# Patient Record
Sex: Male | Born: 1955 | Race: White | Hispanic: No | Marital: Married | Smoking: Former smoker
Health system: Southern US, Community
[De-identification: ages and names within clinical notes are randomized; demographics above are authoritative.]

## PROBLEM LIST (undated history)

## (undated) DIAGNOSIS — G4730 Sleep apnea, unspecified: Secondary | ICD-10-CM

## (undated) DIAGNOSIS — I10 Essential (primary) hypertension: Secondary | ICD-10-CM

## (undated) HISTORY — PX: NECK SURGERY: SHX720

## (undated) HISTORY — PX: CHOLECYSTECTOMY: SHX55

## (undated) HISTORY — PX: TONSILLECTOMY: SUR1361

## (undated) HISTORY — PX: HERNIA REPAIR: SHX51

## (undated) HISTORY — PX: JOINT REPLACEMENT: SHX530

---

## 2013-04-04 DIAGNOSIS — G5620 Lesion of ulnar nerve, unspecified upper limb: Secondary | ICD-10-CM | POA: Insufficient documentation

## 2013-04-04 DIAGNOSIS — M7700 Medial epicondylitis, unspecified elbow: Secondary | ICD-10-CM | POA: Insufficient documentation

## 2013-04-04 DIAGNOSIS — M7520 Bicipital tendinitis, unspecified shoulder: Secondary | ICD-10-CM | POA: Insufficient documentation

## 2013-04-04 DIAGNOSIS — M542 Cervicalgia: Secondary | ICD-10-CM | POA: Insufficient documentation

## 2013-04-23 ENCOUNTER — Ambulatory Visit: Payer: Self-pay | Admitting: Neurology

## 2013-06-12 DIAGNOSIS — Z87448 Personal history of other diseases of urinary system: Secondary | ICD-10-CM | POA: Insufficient documentation

## 2013-06-16 DIAGNOSIS — IMO0001 Reserved for inherently not codable concepts without codable children: Secondary | ICD-10-CM | POA: Insufficient documentation

## 2013-06-16 DIAGNOSIS — E559 Vitamin D deficiency, unspecified: Secondary | ICD-10-CM | POA: Insufficient documentation

## 2013-06-20 DIAGNOSIS — K219 Gastro-esophageal reflux disease without esophagitis: Secondary | ICD-10-CM | POA: Insufficient documentation

## 2013-06-20 DIAGNOSIS — M4802 Spinal stenosis, cervical region: Secondary | ICD-10-CM | POA: Insufficient documentation

## 2013-06-20 DIAGNOSIS — N400 Enlarged prostate without lower urinary tract symptoms: Secondary | ICD-10-CM | POA: Insufficient documentation

## 2015-03-18 ENCOUNTER — Other Ambulatory Visit: Payer: Self-pay | Admitting: Neurology

## 2015-03-18 DIAGNOSIS — M5412 Radiculopathy, cervical region: Secondary | ICD-10-CM

## 2015-03-25 ENCOUNTER — Ambulatory Visit: Admission: RE | Admit: 2015-03-25 | Payer: Self-pay | Source: Ambulatory Visit

## 2015-03-26 ENCOUNTER — Ambulatory Visit
Admission: RE | Admit: 2015-03-26 | Discharge: 2015-03-26 | Disposition: A | Discharge status: Home / Self Care | Payer: 59 | Source: Ambulatory Visit | Attending: Neurology | Admitting: Neurology

## 2015-03-26 DIAGNOSIS — M5412 Radiculopathy, cervical region: Secondary | ICD-10-CM

## 2015-07-19 DIAGNOSIS — R350 Frequency of micturition: Secondary | ICD-10-CM | POA: Insufficient documentation

## 2015-09-22 DIAGNOSIS — R0981 Nasal congestion: Secondary | ICD-10-CM | POA: Insufficient documentation

## 2015-10-15 ENCOUNTER — Other Ambulatory Visit: Payer: Self-pay | Admitting: Neurology

## 2015-10-15 DIAGNOSIS — M5412 Radiculopathy, cervical region: Secondary | ICD-10-CM

## 2015-11-03 ENCOUNTER — Ambulatory Visit
Admission: RE | Admit: 2015-11-03 | Discharge: 2015-11-03 | Disposition: A | Discharge status: Home / Self Care | Payer: 59 | Source: Ambulatory Visit | Attending: Neurology | Admitting: Neurology

## 2015-11-03 DIAGNOSIS — M4802 Spinal stenosis, cervical region: Secondary | ICD-10-CM | POA: Diagnosis not present

## 2015-11-03 DIAGNOSIS — J309 Allergic rhinitis, unspecified: Secondary | ICD-10-CM | POA: Insufficient documentation

## 2015-11-03 DIAGNOSIS — Z981 Arthrodesis status: Secondary | ICD-10-CM | POA: Diagnosis not present

## 2015-11-03 DIAGNOSIS — Z9889 Other specified postprocedural states: Secondary | ICD-10-CM | POA: Diagnosis present

## 2015-11-03 DIAGNOSIS — M5412 Radiculopathy, cervical region: Secondary | ICD-10-CM | POA: Diagnosis present

## 2015-11-03 DIAGNOSIS — M4682 Other specified inflammatory spondylopathies, cervical region: Secondary | ICD-10-CM | POA: Insufficient documentation

## 2015-11-03 MED ORDER — GADOBENATE DIMEGLUMINE 529 MG/ML IV SOLN
20.0000 mL | Freq: Once | INTRAVENOUS | Status: AC | PRN
  Administered 2015-11-03: 20 mL via INTRAVENOUS

## 2017-10-19 DIAGNOSIS — I10 Essential (primary) hypertension: Secondary | ICD-10-CM | POA: Insufficient documentation

## 2018-01-21 ENCOUNTER — Other Ambulatory Visit: Payer: Self-pay | Admitting: Neurology

## 2018-01-21 DIAGNOSIS — R200 Anesthesia of skin: Secondary | ICD-10-CM

## 2018-01-25 ENCOUNTER — Ambulatory Visit
Admission: RE | Admit: 2018-01-25 | Discharge: 2018-01-25 | Disposition: A | Discharge status: Home / Self Care | Payer: 59 | Source: Ambulatory Visit | Attending: Neurology | Admitting: Neurology

## 2018-01-25 ENCOUNTER — Ambulatory Visit
Admission: RE | Admit: 2018-01-25 | Discharge: 2018-01-25 | Disposition: A | Discharge status: Home / Self Care | Payer: Managed Care, Other (non HMO) | Source: Ambulatory Visit | Attending: Neurology | Admitting: Neurology

## 2018-01-25 DIAGNOSIS — Z9889 Other specified postprocedural states: Secondary | ICD-10-CM | POA: Diagnosis not present

## 2018-01-25 DIAGNOSIS — M4802 Spinal stenosis, cervical region: Secondary | ICD-10-CM | POA: Insufficient documentation

## 2018-01-25 DIAGNOSIS — G9589 Other specified diseases of spinal cord: Secondary | ICD-10-CM | POA: Insufficient documentation

## 2018-01-25 DIAGNOSIS — R200 Anesthesia of skin: Secondary | ICD-10-CM | POA: Diagnosis not present

## 2018-01-30 DIAGNOSIS — G5623 Lesion of ulnar nerve, bilateral upper limbs: Secondary | ICD-10-CM | POA: Insufficient documentation

## 2018-02-11 DIAGNOSIS — H6982 Other specified disorders of Eustachian tube, left ear: Secondary | ICD-10-CM | POA: Insufficient documentation

## 2018-02-12 DIAGNOSIS — M25521 Pain in right elbow: Secondary | ICD-10-CM | POA: Insufficient documentation

## 2018-04-29 DIAGNOSIS — S76119A Strain of unspecified quadriceps muscle, fascia and tendon, initial encounter: Secondary | ICD-10-CM | POA: Insufficient documentation

## 2018-07-17 DIAGNOSIS — D170 Benign lipomatous neoplasm of skin and subcutaneous tissue of head, face and neck: Secondary | ICD-10-CM | POA: Insufficient documentation

## 2018-07-17 DIAGNOSIS — M7061 Trochanteric bursitis, right hip: Secondary | ICD-10-CM | POA: Insufficient documentation

## 2018-08-19 DIAGNOSIS — Z6841 Body Mass Index (BMI) 40.0 and over, adult: Secondary | ICD-10-CM | POA: Insufficient documentation

## 2019-01-10 DIAGNOSIS — M1711 Unilateral primary osteoarthritis, right knee: Secondary | ICD-10-CM | POA: Insufficient documentation

## 2019-03-29 ENCOUNTER — Ambulatory Visit
Admission: EM | Admit: 2019-03-29 | Discharge: 2019-03-29 | Disposition: A | Discharge status: Home / Self Care | Payer: 59 | Attending: Family Medicine | Admitting: Family Medicine

## 2019-03-29 ENCOUNTER — Encounter: Payer: Self-pay | Admitting: Emergency Medicine

## 2019-03-29 ENCOUNTER — Other Ambulatory Visit: Payer: Self-pay

## 2019-03-29 DIAGNOSIS — M545 Low back pain, unspecified: Secondary | ICD-10-CM

## 2019-03-29 HISTORY — DX: Essential (primary) hypertension: I10

## 2019-03-29 MED ORDER — CYCLOBENZAPRINE HCL 10 MG PO TABS: 10.0000 mg | ORAL_TABLET | Freq: Two times a day (BID) | ORAL | 0 refills | Status: DC | PRN

## 2019-03-29 MED ORDER — PREDNISONE 10 MG PO TABS: ORAL_TABLET | ORAL | 0 refills | Status: DC

## 2019-03-29 NOTE — Discharge Instructions (Addendum)
Take medication as prescribed. Rest. Drink plenty of fluids. Ice.   Follow up with your primary care physician this week as needed. Return to Urgent care for new or worsening concerns.

## 2019-03-29 NOTE — ED Provider Notes (Signed)
[facility] ____________________________________________  Time seen: Approximately 9:12 AM  I have reviewed the triage vital signs and the nursing notes.   HISTORY  Chief Complaint Back Pain   HPI Darrell Rios is a 63 y.o. male presenting for evaluation of left lower back pain present gradually worsening over the last 1 week.  Patient denies any fall, direct injury or direct trauma.  Denies abrupt onset.  Patient did recently move a headboard and states the next morning he noticed some pain and states possibly related.  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  States the pain stays in the left lower back.  Denies pain radiation down legs.  States he also has a bad right knee and he often compensates when walking for this.  Denies rash.  Denies any urinary or bowel retention or incontinence.  Denies atypical paresthesias.  States he often feels a spasm catching pain with movement.  States pain is mostly with changing positions and walking.  Denies recent fevers, cough, congestion, dysuria or bowel changes.  Reports otherwise doing well.  Unresolved with over-the-counter Aleve and Tylenol.  Sharyne Peach, MD: PCP   Past Medical History:  Diagnosis Date  . Hypertension   Degenerative disc disease.  There are no active problems to display for this patient.   Past Surgical History:  Procedure Laterality Date  . CHOLECYSTECTOMY    . HERNIA REPAIR    . NECK SURGERY    . TONSILLECTOMY    Carpal tunnel and ulnar nerve release.   No current facility-administered medications for this encounter.   Current Outpatient Medications:  .  amlodipine-olmesartan (AZOR) 10-20 MG tablet, Take by mouth., Disp: , Rfl:  .  aspirin EC 81 MG tablet, Take by mouth., Disp: , Rfl:  .  fexofenadine-pseudoephedrine (ALLEGRA-D 24) 180-240 MG 24 hr  tablet, Take by mouth., Disp: , Rfl:  .  finasteride (PROSCAR) 5 MG tablet, Take by mouth., Disp: , Rfl:  .  gabapentin (NEURONTIN) 400 MG capsule, Take by mouth., Disp: , Rfl:  .  ibuprofen (ADVIL) 800 MG tablet, Take by mouth., Disp: , Rfl:  .  venlafaxine (EFFEXOR) 75 MG tablet, Take by mouth., Disp: , Rfl:  .  cyclobenzaprine (FLEXERIL) 10 MG tablet, Take 1 tablet (10 mg total) by mouth 2 (two) times daily as needed for muscle spasms. Do not drive while taking as can cause drowsiness, Disp: 15 tablet, Rfl: 0 .  predniSONE (DELTASONE) 10 MG tablet, Start 60 mg po day one, then 50 mg po day two, taper by 10 mg daily until complete., Disp: 21 tablet, Rfl: 0  Allergies Patient has no known allergies.  Family History  Problem Relation Age of Onset  . Cancer Father     Social History Social History   Tobacco Use  . Smoking status: Current Every Day Smoker    Types: Cigarettes  . Smokeless tobacco: Never Used  Substance Use Topics  . Alcohol use: Not Currently  . Drug use: Never    Review of Systems Constitutional: No fever ENT: No sore throat. Cardiovascular: Denies chest pain. Respiratory: Denies shortness of breath. Gastrointestinal: No abdominal pain.  No nausea, no vomiting.  No diarrhea.  No constipation. Genitourinary: Negative for dysuria. Musculoskeletal: Positive for back pain. Skin: Negative for rash. Neurological: Negative for headaches.   ____________________________________________   PHYSICAL EXAM:  VITAL  SIGNS: ED Triage Vitals  Enc Vitals Group     BP 03/29/19 0854 130/81     Pulse Rate 03/29/19 0854 72     Resp 03/29/19 0854 16     Temp 03/29/19 0854 98.4 F (36.9 C)     Temp Source 03/29/19 0854 Oral     SpO2 03/29/19 0854 99 %     Weight 03/29/19 0849 295 lb (133.8 kg)     Height 03/29/19 0849 5\' 9"  (1.753 m)     Head Circumference --      Peak Flow --      Pain Score 03/29/19 0848 6     Pain Loc --      Pain Edu? --      Excl. in [place]? --      Constitutional: Alert and oriented. Well appearing and in no acute distress. Eyes: Conjunctivae are normal.  ENT      Head: Normocephalic and atraumatic. Cardiovascular: Normal rate, regular rhythm. Grossly normal heart sounds.  Good peripheral circulation. Respiratory: Normal respiratory effort without tachypnea nor retractions. Breath sounds are clear and equal bilaterally. No wheezes, rales, rhonchi. Gastrointestinal: Soft and nontender. Obese abdomen. No CVA tenderness. Musculoskeletal: No midline cervical, thoracic or lumbar tenderness to palpation. Bilateral pedal pulses equal and easily palpated. Except: No midline tenderness.  Left lower para lumbar tenderness to direct palpation with palpable muscle spasm, mild pain with right and left lumbar rotation as well as flexion extension but full range of motion present, no saddle anesthesia, changes positions quickly, steady gait, no pain with standing bilateral knee lifts. Neurologic:  Normal speech and language. No gross focal neurologic deficits are appreciated. Speech is normal. No gait instability.  Skin:  Skin is warm, dry and intact. No rash noted. Psychiatric: Mood and affect are normal. Speech and behavior are normal. Patient exhibits appropriate insight and judgment   ___________________________________________   LABS (all labs ordered are listed, but only abnormal results are displayed)  Labs Reviewed - No data to display   PROCEDURES Procedures    INITIAL IMPRESSION / ASSESSMENT AND PLAN / ED COURSE  Pertinent labs & imaging results that were available during my care of the patient were reviewed by me and considered in my medical decision making (see chart for details).  Well-appearing patient.  No acute distress.  Left lower back pain.  No trauma.  No midline tenderness.  Suspect strain injuries.  Will start on oral prednisone and PRN Flexeril.  Encouraged ice, supportive care and monitoring.Discussed indication,  risks and benefits of medications with patient.  Discussed follow up with Primary care physician this week. Discussed follow up and return parameters including no resolution or any worsening concerns. Patient verbalized understanding and agreed to plan.   ____________________________________________   FINAL CLINICAL IMPRESSION(S) / ED DIAGNOSES  Final diagnoses:  Acute left-sided low back pain without sciatica     ED Discharge Orders         Ordered    predniSONE (DELTASONE) 10 MG tablet     03/29/19 0910    cyclobenzaprine (FLEXERIL) 10 MG tablet  2 times daily PRN     03/29/19 0910           Note: This dictation was prepared with Dragon dictation along with smaller phrase technology. Any transcriptional errors that result from this process are unintentional.         Marylene Land, NP 03/29/19 [phone]

## 2019-03-29 NOTE — ED Triage Notes (Signed)
Patient c/o left sided lower back pain for almost a week.  Patient denies injury or fall.  Patient denies any urinary problems.  Patient denies fevers.

## 2019-08-11 DIAGNOSIS — H6592 Unspecified nonsuppurative otitis media, left ear: Secondary | ICD-10-CM | POA: Insufficient documentation

## 2019-08-11 DIAGNOSIS — H9011 Conductive hearing loss, unilateral, right ear, with unrestricted hearing on the contralateral side: Secondary | ICD-10-CM | POA: Insufficient documentation

## 2020-01-22 DIAGNOSIS — R0602 Shortness of breath: Secondary | ICD-10-CM | POA: Insufficient documentation

## 2020-04-04 DIAGNOSIS — U071 COVID-19: Secondary | ICD-10-CM

## 2020-04-04 HISTORY — DX: COVID-19: U07.1

## 2020-04-14 DIAGNOSIS — M19011 Primary osteoarthritis, right shoulder: Secondary | ICD-10-CM | POA: Insufficient documentation

## 2020-04-14 DIAGNOSIS — M75101 Unspecified rotator cuff tear or rupture of right shoulder, not specified as traumatic: Secondary | ICD-10-CM | POA: Insufficient documentation

## 2020-05-10 ENCOUNTER — Other Ambulatory Visit: Payer: Self-pay | Admitting: Internal Medicine

## 2020-05-10 DIAGNOSIS — I208 Other forms of angina pectoris: Secondary | ICD-10-CM

## 2020-05-10 DIAGNOSIS — R0602 Shortness of breath: Secondary | ICD-10-CM

## 2020-05-18 ENCOUNTER — Telehealth (HOSPITAL_COMMUNITY): Payer: Self-pay | Admitting: Emergency Medicine

## 2020-05-18 MED ORDER — METOPROLOL TARTRATE 100 MG PO TABS: 100.0000 mg | ORAL_TABLET | Freq: Once | ORAL | 0 refills | Status: DC

## 2020-05-18 NOTE — Telephone Encounter (Signed)
Reaching out to patient to offer assistance regarding upcoming cardiac imaging study; pt verbalizes understanding of appt date/time, parking situation and where to check in, pre-test NPO status and medications ordered, and verified current allergies; name and call back number provided for further questions should they arise Marchia Bond RN Navigator Cardiac Imaging [facility] [phone] office [phone] cell   Prescribing 100mg  metoprolol tartrate for 2 hr prior to CT for HR control. Patient aware and verbalized understanding. Clarise Cruz

## 2020-05-20 ENCOUNTER — Other Ambulatory Visit: Payer: Self-pay

## 2020-05-20 ENCOUNTER — Ambulatory Visit
Admission: RE | Admit: 2020-05-20 | Discharge: 2020-05-20 | Disposition: A | Discharge status: Home / Self Care | Payer: BC Managed Care – PPO | Source: Ambulatory Visit | Attending: Internal Medicine | Admitting: Internal Medicine

## 2020-05-20 DIAGNOSIS — I208 Other forms of angina pectoris: Secondary | ICD-10-CM | POA: Diagnosis present

## 2020-05-20 DIAGNOSIS — R0602 Shortness of breath: Secondary | ICD-10-CM | POA: Diagnosis present

## 2020-05-20 HISTORY — DX: Sleep apnea, unspecified: G47.30

## 2020-05-20 MED ORDER — IOHEXOL 350 MG/ML SOLN
95.0000 mL | Freq: Once | INTRAVENOUS | Status: AC | PRN
  Administered 2020-05-20: 95 mL via INTRAVENOUS

## 2020-05-20 MED ORDER — NITROGLYCERIN 0.4 MG SL SUBL
0.8000 mg | SUBLINGUAL_TABLET | Freq: Once | SUBLINGUAL | Status: AC
  Administered 2020-05-20: 0.8 mg via SUBLINGUAL

## 2020-05-20 NOTE — Progress Notes (Signed)
Patient tolerated CT well. Drank water after. Ambulated to exit steady gait.  

## 2020-08-09 ENCOUNTER — Ambulatory Visit (INDEPENDENT_AMBULATORY_CARE_PROVIDER_SITE_OTHER): Payer: BC Managed Care – PPO | Admitting: Vascular Surgery

## 2020-08-09 ENCOUNTER — Other Ambulatory Visit: Payer: Self-pay

## 2020-08-09 ENCOUNTER — Encounter (INDEPENDENT_AMBULATORY_CARE_PROVIDER_SITE_OTHER): Payer: Self-pay | Admitting: Vascular Surgery

## 2020-08-09 VITALS — BP 130/78 | HR 70 | Resp 16 | Ht 69.0 in | Wt 291.6 lb

## 2020-08-09 DIAGNOSIS — I890 Lymphedema, not elsewhere classified: Secondary | ICD-10-CM

## 2020-08-09 DIAGNOSIS — I10 Essential (primary) hypertension: Secondary | ICD-10-CM | POA: Diagnosis not present

## 2020-08-09 DIAGNOSIS — K219 Gastro-esophageal reflux disease without esophagitis: Secondary | ICD-10-CM | POA: Diagnosis not present

## 2020-08-09 DIAGNOSIS — I712 Thoracic aortic aneurysm, without rupture: Secondary | ICD-10-CM

## 2020-08-09 DIAGNOSIS — K635 Polyp of colon: Secondary | ICD-10-CM | POA: Insufficient documentation

## 2020-08-09 DIAGNOSIS — G4733 Obstructive sleep apnea (adult) (pediatric): Secondary | ICD-10-CM | POA: Insufficient documentation

## 2020-08-09 DIAGNOSIS — I7121 Aneurysm of the ascending aorta, without rupture: Secondary | ICD-10-CM | POA: Insufficient documentation

## 2020-08-09 DIAGNOSIS — G5601 Carpal tunnel syndrome, right upper limb: Secondary | ICD-10-CM | POA: Insufficient documentation

## 2020-08-09 DIAGNOSIS — N419 Inflammatory disease of prostate, unspecified: Secondary | ICD-10-CM | POA: Insufficient documentation

## 2020-08-09 NOTE — Progress Notes (Signed)
MRN : [id number]  Darrell Rios is a 64 y.o. (07-18-1956) male who presents with chief complaint of No chief complaint on file. Marland Kitchen  History of Present Illness:  Patient is seen for evaluation of  Two issues: 1.leg swelling and 2. An ascending TAA noted on CT scan. The patient first noticed the swelling remotely but is now concerned because of a significant increase in the overall edema. The swelling is associated with pain and discoloration. The patient notes that in the morning the legs are significantly improved but they steadily worsened throughout the course of the day. Elevation makes the legs better, dependency makes them much worse.   There is no history of ulcerations associated with the swelling.   The patient denies any recent changes in their medications.  The patient has not been wearing graduated compression.  The patient has no had any past angiography, interventions or vascular surgery.  Coronary CT was performed 05/20/2020.  This showed an incidental 4.8 cm ascending TAA.  He denies chest or back pain.  The patient denies a history of DVT or PE. There is no prior history of phlebitis.  There is no history of radiation treatment to the groin or pelvis No history of malignancies. No history of trauma or groin or pelvic surgery. No history of foreign travel or parasitic infections area    No outpatient medications have been marked as taking for the 08/09/20 encounter (Appointment) with Delana Meyer, Dolores Lory, MD.    Past Medical History:  Diagnosis Date  . COVID-19 04/04/2020   asymptomatic COVID  . Hypertension   . Sleep apnea     Past Surgical History:  Procedure Laterality Date  . CHOLECYSTECTOMY    . HERNIA REPAIR    . NECK SURGERY    . TONSILLECTOMY      Social History Social History   Tobacco Use  . Smoking status: Current Every Day Smoker    Types: Cigarettes  . Smokeless tobacco: Never Used  Vaping Use  . Vaping Use: Never used  Substance  Use Topics  . Alcohol use: Not Currently  . Drug use: Never    Family History Family History  Problem Relation Age of Onset  . Cancer Father   No family history of bleeding/clotting disorders, porphyria or autoimmune disease   No Known Allergies   REVIEW OF SYSTEMS (Negative unless checked)  Constitutional: [] Weight loss  [] Fever  [] Chills Cardiac: [] Chest pain   [] Chest pressure   [] Palpitations   [] Shortness of breath when laying flat   [] Shortness of breath with exertion. Vascular:  [] Pain in legs with walking   [] Pain in legs at rest  [] History of DVT   [] Phlebitis   [x] Swelling in legs   [] Varicose veins   [] Non-healing ulcers Pulmonary:   [] Uses home oxygen   [] Productive cough   [] Hemoptysis   [] Wheeze  [] COPD   [] Asthma Neurologic:  [] Dizziness   [] Seizures   [] History of stroke   [] History of TIA  [] Aphasia   [] Vissual changes   [] Weakness or numbness in arm   [] Weakness or numbness in leg Musculoskeletal:   [] Joint swelling   [] Joint pain   [] Low back pain Hematologic:  [] Easy bruising  [] Easy bleeding   [] Hypercoagulable state   [] Anemic Gastrointestinal:  [] Diarrhea   [] Vomiting  [x] Gastroesophageal reflux/heartburn   [] Difficulty swallowing. Genitourinary:  [] Chronic kidney disease   [] Difficult urination  [] Frequent urination   [] Blood in urine Skin:  [] Rashes   [] Ulcers  Psychological:  [] History of  anxiety   []  History of major depression.  Physical Examination  There were no vitals filed for this visit. There is no height or weight on file to calculate BMI. Gen: WD/WN, NAD Head: [place]/AT, No temporalis wasting.  Ear/Nose/Throat: Hearing grossly intact, nares w/o erythema or drainage, poor dentition Eyes: PER, EOMI, sclera nonicteric.  Neck: Supple, no masses.  No bruit or JVD.  Pulmonary:  Good air movement, clear to auscultation bilaterally, no use of accessory muscles.  Cardiac: RRR, normal S1, S2, no Murmurs. Vascular:  Mild edema Vessel Right Left  Radial  Palpable Palpable  Gastrointestinal: soft, non-distended. No guarding/no peritoneal signs.  Musculoskeletal: M/S 5/5 throughout.  No deformity or atrophy.  Neurologic: CN 2-12 intact. Pain and light touch intact in extremities.  Symmetrical.  Speech is fluent. Motor exam as listed above. Psychiatric: Judgment intact, Mood & affect appropriate for pt's clinical situation. Dermatologic: No rashes or ulcers noted.  No changes consistent with cellulitis.  CBC No results found for: WBC, HGB, HCT, MCV, PLT  BMET No results found for: NA, K, CL, CO2, GLUCOSE, BUN, CREATININE, CALCIUM, GFRNONAA, GFRAA CrCl cannot be calculated (No successful lab value found.).  COAG No results found for: INR, PROTIME  Radiology No results found.   Assessment/Plan 1. Ascending aortic aneurysm (HCC) No surgery or intervention at this time. The patient has an asymptomatic ascending thoracic aortic aneurysm that is less than 5 cm in maximal diameter.  I have discussed the natural history of abdominal aortic aneurysm and the small risk of rupture for aneurysm less than 6 cm in size.  However, as these small aneurysms tend to enlarge over time, continued surveillance with  CT scan is mandatory.   I have also discussed optimizing medical management with hypertension and lipid control and the importance of abstinence from tobacco.  The patient is also encouraged to exercise a minimum of 30 minutes 4 times a week.   Should the patient develop new onset chest or back pain or signs of peripheral embolization they are instructed to seek medical attention immediately and to alert the physician providing care that they have an aneurysm.   The patient voices their understanding. The patient will return in 24 months with an aortic duplex.  2. Lymphedema No surgery or intervention at this point in time.  I have reviewed my discussion with the patient regarding venous insufficiency and why it causes symptoms. I have  discussed with the patient the chronic skin changes that accompany venous insufficiency and the long term sequela such as ulceration. Patient will contnue wearing graduated compression stockings on a daily basis, as this has provided excellent control of his edema. The patient will put the stockings on first thing in the morning and removing them in the evening. The patient is reminded not to sleep in the stockings.  In addition, behavioral modification including elevation during the day will be initiated. Exercise is strongly encouraged.  Given the patient's good control and lack of any problems regarding the venous insufficiency and lymphedema a lymph pump in not need at this time.    3. Essential hypertension Continue antihypertensive medications as already ordered, these medications have been reviewed and there are no changes at this time.   4. Gastroesophageal reflux disease, unspecified whether esophagitis present Continue PPI as already ordered, this medication has been reviewed and there are no changes at this time.  Avoidence of caffeine and alcohol  Moderate elevation of the head of the bed  Hortencia Pilar, MD  08/09/2020 11:07 AM

## 2020-11-30 IMAGING — CT CT HEART MORP W/ CTA COR W/ SCORE W/ CA W/CM &/OR W/O CM
2 of 13 series · 5 of 20 positions shown, 6 images · non-contrast
Comparison: None.

Addendum:
CLINICAL DATA: Dyspnea on exertion, former smoker, obesity, angina
equivalent

EXAM:
Cardiac/Coronary  CTA
TECHNIQUE: The patient was scanned on a Siemens Somatoform go.Top scanner.

[Series 28: multiphase % cta coronary 0.80 · axial · 0.46mm/px · z∈[+1788,+1828]mm · 2 of 2370 slices shown]
[im 790/2370  vessel]
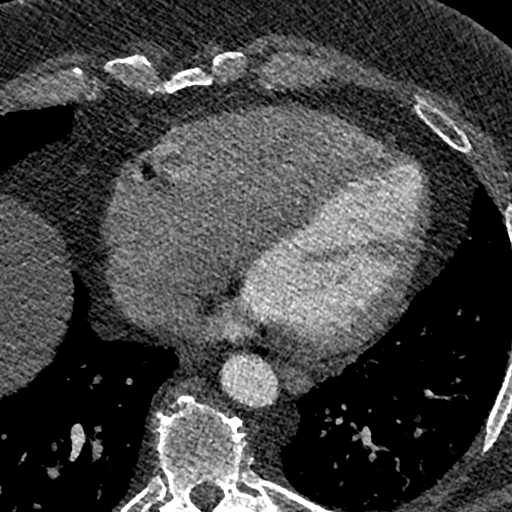
[im 1580/2370  vessel]
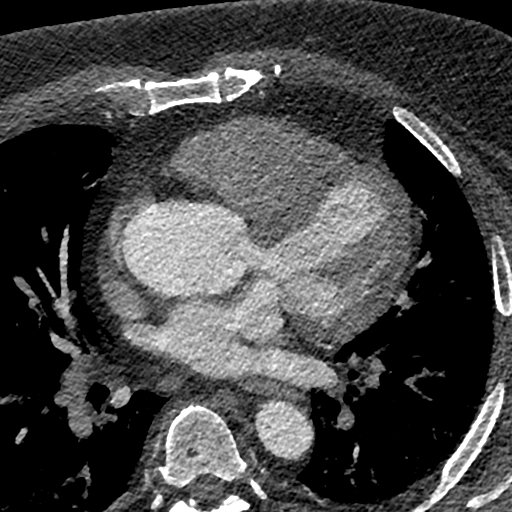

[Series 46: ms multiphase cta coronary 0.60 · axial · 0.46mm/px · z∈[+1779,+1838]mm · 3 of 2376 slices shown, 4 images]
[im 594/2376  vessel]
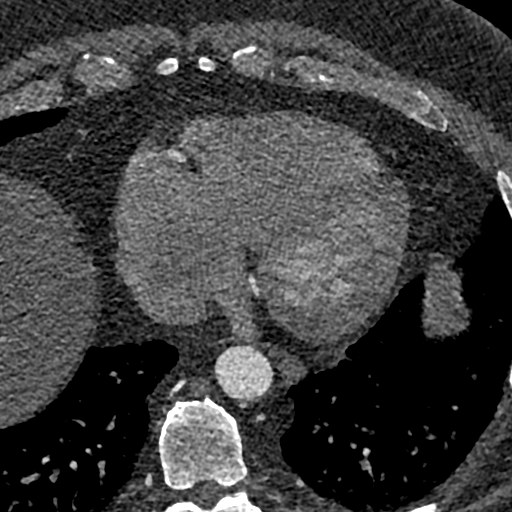
[im 594/2376  lung]
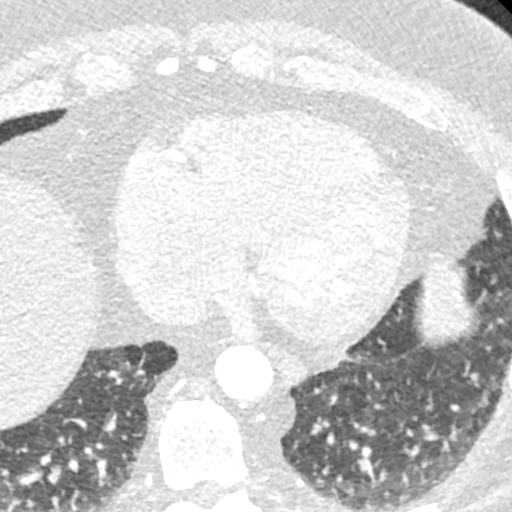
[im 1188/2376  vessel]
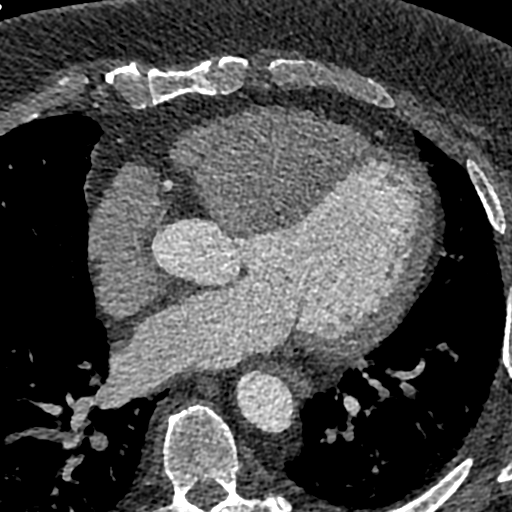
[im 1782/2376  vessel]
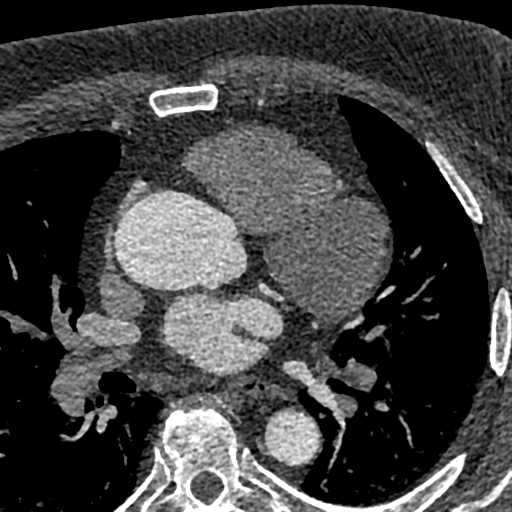

[5 of 20 positions shown; findings below may reference images not displayed]

FINDINGS: A retrospective scan was triggered in the descending thoracic aorta.
Axial non-contrast 3 mm slices were carried out through the heart.
The data set was analyzed on a dedicated work station and scored
using the Agatson method. Gantry rotation speed was 330 msecs and
collimation was .6 mm. 100mg of metoprolol and 0.8 mg of sl NTG was
given. The 3D data set was reconstructed in 5% intervals of the
60-95 % of the R-R cycle. Diastolic phases were analyzed on a
dedicated work station using MPR, MIP and VRT modes. The patient
received 95 cc of contrast.

Aorta: Normal size. Minimal descending aortic wall calcifications.
No dissection.

Aortic Valve:  Trileaflet.  No calcifications.

Coronary Arteries:  Normal coronary origin.  Right dominance.

RCA is a large dominant artery that gives rise to PDA and PLA. There
is no plaque.

Left main is a large artery that gives rise to LAD and LCX arteries.

LAD is a large vessel that has no plaque.

LCX is a non-dominant artery that gives rise to one medium size
obtuse marginal branch. There is no plaque.

Other findings:

Normal pulmonary vein drainage into the left atrium.

Normal left atrial appendage without a thrombus.

Normal size of the pulmonary artery.
IMPRESSION: 1. Coronary calcium score of 0. Patient is low risk for near term
coronary events

2. Normal coronary origin with right dominance.

3. No evidence of CAD.

4. CAD-RADS 0. Consider non-atherosclerotic causes of chest pain.

EXAM:
OVER-READ INTERPRETATION  CT CHEST

The following report is an over-read performed by radiologist Dr.
over-read does not include interpretation of cardiac or coronary
anatomy or pathology. The coronary calcium score and cardiac CTA
interpretation by the cardiologist is attached.
FINDINGS: Aortic atherosclerosis with aneurysmal dilatation of the ascending
thoracic aorta (4.8 cm in diameter). Within the visualized portions
of the thorax there are no suspicious appearing pulmonary nodules or
masses, there is no acute consolidative airspace disease, no pleural
effusions, no pneumothorax and no lymphadenopathy. Visualized
portions of the upper abdomen are unremarkable. There are no
aggressive appearing lytic or blastic lesions noted in the
visualized portions of the skeleton.
IMPRESSION: 1. Aortic atherosclerosis with aneurysmal dilatation of the
ascending thoracic aorta (4.8 cm in diameter). Ascending thoracic
aortic aneurysm. Recommend semi-annual imaging followup by CTA or
MRA and referral to cardiothoracic surgery if not already obtained.
This recommendation follows 1747
ACCF/AHA/AATS/ACR/ASA/SCA/JUHI/HELSPER/VAMSHI/MULBAH Guidelines for the
Diagnosis and Management of Patients With Thoracic Aortic Disease.
Circulation. 1747; 121: E266-e369. Aortic aneurysm NOS
(JDBEU-P2U.O).

Aortic Atherosclerosis (JDBEU-SSY.Y). Aortic aneurysm NOS
(JDBEU-P2U.O).

*** End of Addendum ***
FINDINGS: A retrospective scan was triggered in the descending thoracic aorta.
Axial non-contrast 3 mm slices were carried out through the heart.
The data set was analyzed on a dedicated work station and scored
using the Agatson method. Gantry rotation speed was 330 msecs and
collimation was .6 mm. 100mg of metoprolol and 0.8 mg of sl NTG was
given. The 3D data set was reconstructed in 5% intervals of the
60-95 % of the R-R cycle. Diastolic phases were analyzed on a
dedicated work station using MPR, MIP and VRT modes. The patient
received 95 cc of contrast.

Aorta: Normal size. Minimal descending aortic wall calcifications.
No dissection.

Aortic Valve:  Trileaflet.  No calcifications.

Coronary Arteries:  Normal coronary origin.  Right dominance.

RCA is a large dominant artery that gives rise to PDA and PLA. There
is no plaque.

Left main is a large artery that gives rise to LAD and LCX arteries.

LAD is a large vessel that has no plaque.

LCX is a non-dominant artery that gives rise to one medium size
obtuse marginal branch. There is no plaque.

Other findings:

Normal pulmonary vein drainage into the left atrium.

Normal left atrial appendage without a thrombus.

Normal size of the pulmonary artery.
IMPRESSION: 1. Coronary calcium score of 0. Patient is low risk for near term
coronary events

2. Normal coronary origin with right dominance.

3. No evidence of CAD.

4. CAD-RADS 0. Consider non-atherosclerotic causes of chest pain.

## 2021-08-04 ENCOUNTER — Other Ambulatory Visit: Payer: Self-pay

## 2021-08-04 DIAGNOSIS — N179 Acute kidney failure, unspecified: Secondary | ICD-10-CM | POA: Diagnosis not present

## 2021-08-04 DIAGNOSIS — N202 Calculus of kidney with calculus of ureter: Secondary | ICD-10-CM | POA: Diagnosis not present

## 2021-08-04 DIAGNOSIS — Z8616 Personal history of COVID-19: Secondary | ICD-10-CM | POA: Diagnosis not present

## 2021-08-04 DIAGNOSIS — I10 Essential (primary) hypertension: Secondary | ICD-10-CM | POA: Insufficient documentation

## 2021-08-04 DIAGNOSIS — R112 Nausea with vomiting, unspecified: Secondary | ICD-10-CM | POA: Diagnosis present

## 2021-08-04 DIAGNOSIS — Z79899 Other long term (current) drug therapy: Secondary | ICD-10-CM | POA: Diagnosis not present

## 2021-08-04 DIAGNOSIS — Z7982 Long term (current) use of aspirin: Secondary | ICD-10-CM | POA: Insufficient documentation

## 2021-08-04 DIAGNOSIS — Z87891 Personal history of nicotine dependence: Secondary | ICD-10-CM | POA: Diagnosis not present

## 2021-08-04 LAB — URINALYSIS, ROUTINE W REFLEX MICROSCOPIC
Bilirubin Urine: NEGATIVE
Glucose, UA: NEGATIVE mg/dL
Hgb urine dipstick: NEGATIVE
Ketones, ur: 20 mg/dL — AB
Leukocytes,Ua: NEGATIVE
Nitrite: NEGATIVE
Protein, ur: NEGATIVE mg/dL
Specific Gravity, Urine: 1.029 (ref 1.005–1.030)
pH: 6 (ref 5.0–8.0)

## 2021-08-04 LAB — CBC
HCT: 40.8 % (ref 39.0–52.0)
Hemoglobin: 13.1 g/dL (ref 13.0–17.0)
MCH: 28.7 pg (ref 26.0–34.0)
MCHC: 32.1 g/dL (ref 30.0–36.0)
MCV: 89.5 fL (ref 80.0–100.0)
Platelets: 347 10*3/uL (ref 150–400)
RBC: 4.56 MIL/uL (ref 4.22–5.81)
RDW: 13.2 % (ref 11.5–15.5)
WBC: 11.5 10*3/uL — ABNORMAL HIGH (ref 4.0–10.5)
nRBC: 0 % (ref 0.0–0.2)

## 2021-08-04 LAB — BASIC METABOLIC PANEL
Anion gap: 7 (ref 5–15)
BUN: 23 mg/dL (ref 8–23)
CO2: 25 mmol/L (ref 22–32)
Calcium: 8.8 mg/dL — ABNORMAL LOW (ref 8.9–10.3)
Chloride: 106 mmol/L (ref 98–111)
Creatinine, Ser: 1.6 mg/dL — ABNORMAL HIGH (ref 0.61–1.24)
GFR, Estimated: 48 mL/min — ABNORMAL LOW (ref >60)
Glucose, Bld: 90 mg/dL (ref 70–99)
Potassium: 4.3 mmol/L (ref 3.5–5.1)
Sodium: 138 mmol/L (ref 135–145)

## 2021-08-04 NOTE — ED Triage Notes (Signed)
Pt was seen earlier today for a CT scan at [facility] due to left flank pain. Pt Ct shows a obstructing stone in left ureter. Pt was told to come to ED.

## 2021-08-05 ENCOUNTER — Emergency Department
Admission: EM | Admit: 2021-08-05 | Discharge: 2021-08-05 | Disposition: A | Discharge status: Home / Self Care | Payer: BC Managed Care – PPO | Attending: Emergency Medicine | Admitting: Emergency Medicine

## 2021-08-05 DIAGNOSIS — N200 Calculus of kidney: Secondary | ICD-10-CM

## 2021-08-05 DIAGNOSIS — N201 Calculus of ureter: Secondary | ICD-10-CM

## 2021-08-05 DIAGNOSIS — N23 Unspecified renal colic: Secondary | ICD-10-CM

## 2021-08-05 DIAGNOSIS — N179 Acute kidney failure, unspecified: Secondary | ICD-10-CM

## 2021-08-05 MED ORDER — OXYCODONE-ACETAMINOPHEN 5-325 MG PO TABS
2.0000 | ORAL_TABLET | Freq: Once | ORAL | Status: AC
  Administered 2021-08-05: 2 via ORAL
  Filled 2021-08-05: qty 2

## 2021-08-05 MED ORDER — TAMSULOSIN HCL 0.4 MG PO CAPS: ORAL_CAPSULE | ORAL | 0 refills | Status: AC

## 2021-08-05 MED ORDER — OXYCODONE-ACETAMINOPHEN 5-325 MG PO TABS: 2.0000 | ORAL_TABLET | Freq: Three times a day (TID) | ORAL | 0 refills | Status: AC | PRN

## 2021-08-05 MED ORDER — ONDANSETRON 4 MG PO TBDP
4.0000 mg | ORAL_TABLET | Freq: Once | ORAL | Status: AC
  Administered 2021-08-05: 4 mg via ORAL
  Filled 2021-08-05: qty 1

## 2021-08-05 MED ORDER — ONDANSETRON 4 MG PO TBDP: ORAL_TABLET | ORAL | 0 refills | Status: DC

## 2021-08-05 MED ORDER — DOCUSATE SODIUM 100 MG PO CAPS
ORAL_CAPSULE | ORAL | 0 refills | Status: AC
Start: 2021-08-05 — End: ?

## 2021-08-05 NOTE — Discharge Instructions (Addendum)
You have been seen in the Emergency Department (ED) today for pain caused by kidney stones.  As we have discussed, please drink plenty of fluids.  Please make a follow up appointment with the physician(s) listed elsewhere in this documentation.  As we discussed, it also appears that you are a bit dehydrated, likely from not feeling well and not eating and drinking adequately over the last few days.  We offered to place an IV and give you IV fluids, but since you are able to tolerate oral intake at this time, you prefer to go home and hydrate at home.  Please remember to drink plenty of water, or preferably something with electrolytes like low calorie Gatorade, to encourage not only improvement in your your kidney function but also to flush out the stone.  You may take pain medication as needed but ONLY as prescribed.  Please also take your prescribed Flomax daily.  If you are going to follow up with Urology for possible lithotripsy, please do not take any ibuprofen, naproxen, aspirin, Toradol, or other NSAID after Tuesday morning, as this may exclude you from lithotripsy.  Please see your doctor as soon as possible as stones may take 1-3 weeks to pass and you may require additional care or medications.  Do not drink alcohol, drive or participate in any other potentially dangerous activities while taking opiate pain medication as it may make you sleepy. Do not take this medication with any other sedating medications, either prescription or over-the-counter. If you were prescribed Percocet or Vicodin, do not take these with acetaminophen (Tylenol) as it is already contained within these medications.   Take Percocet as needed for severe pain.  This medication is an opiate (or narcotic) pain medication and can be habit forming.  Use it as little as possible to achieve adequate pain control.  Do not use or use it with extreme caution if you have a history of opiate abuse or dependence.  If you are on a pain  contract with your primary care doctor or a pain specialist, be sure to let them know you were prescribed this medication today from the [facility] Emergency Department.  This medication is intended for your use only - do not give any to anyone else and keep it in a secure place where nobody else, especially children, have access to it.  It will also cause or worsen constipation, so you may want to consider taking an over-the-counter stool softener while you are taking this medication.  Return to the Emergency Department (ED) or call your doctor if you have any worsening pain, fever, painful urination, are unable to urinate, or develop other symptoms that concern you.

## 2021-08-05 NOTE — ED Provider Notes (Signed)
[facility] Emergency Department Provider Note  ____________________________________________   Event Date/Time   First MD Initiated Contact with Patient 08/05/21 0216     (approximate)  I have reviewed the triage vital signs and the nursing notes.   HISTORY  Chief Complaint Flank Pain    HPI Darrell Rios is a 65 y.o. male who presents for evaluation of left-sided flank pain.  He reports that its been going on for several days and will hit him very hard with severe sharp stabbing pain associated with nausea and vomiting.  It will continue for a while and then it would get better and he feels like something is moving in his flank.  He went to his primary care doctor who ordered an outpatient CT scan.  They then called him and told him he should go to the emergency department because he has a 2 mm obstructing stone and may have a urinary tract infection.  He said that his symptoms are currently mild.  He has a little bit of pain every now and then but it is not bad.  He is not currently nauseated.  He has not having any burning when he urinates and has not seen any blood in his urine.  He denies fever, sore throat, chest pain, shortness of breath, cough.  He has no prior history of kidney stones.  The onset of the pain was acute and severe several days ago and waxes and wanes.     Past Medical History:  Diagnosis Date   COVID-19 04/04/2020   asymptomatic COVID   Hypertension    Sleep apnea     Patient Active Problem List   Diagnosis Date Noted   Carpal tunnel syndrome of right wrist 08/09/2020   Colon polyps 08/09/2020   OSA (obstructive sleep apnea) 08/09/2020   Prostatitis 08/09/2020   Ascending aortic aneurysm 08/09/2020   Lymphedema 08/09/2020   Arthritis of right acromioclavicular joint 04/14/2020   Tear of right rotator cuff 04/14/2020   Shortness of breath 01/22/2020   Conductive hearing loss of right ear with unrestricted hearing of  left ear 08/11/2019   Left otitis media with effusion 08/11/2019   Primary osteoarthritis of right knee 01/10/2019   BMI 40.0-44.9, adult ([place]) 08/19/2018   Lipoma of scalp 07/17/2018   Trochanteric bursitis of both hips 07/17/2018   Strain of quadriceps tendon 04/29/2018   Right elbow pain 02/12/2018   Dysfunction of left eustachian tube 02/11/2018   Ulnar neuropathy of both upper extremities 01/30/2018   Essential hypertension 10/19/2017   Allergic rhinitis 11/03/2015   Chronic nasal congestion 09/22/2015   Increased urinary frequency 07/19/2015   BPH (benign prostatic hyperplasia) 06/20/2013   Cervical spinal stenosis 06/20/2013   GERD (gastroesophageal reflux disease) 06/20/2013   Elevated blood pressure 06/16/2013   Vitamin D deficiency 06/16/2013   H/O hematuria 06/12/2013   Biceps tendonitis 04/04/2013   Golfer's elbow 04/04/2013   Neck pain 04/04/2013   Ulnar nerve impingement 04/04/2013    Past Surgical History:  Procedure Laterality Date   CHOLECYSTECTOMY     HERNIA REPAIR     NECK SURGERY     TONSILLECTOMY      Prior to Admission medications   Medication Sig Start Date End Date Taking? Authorizing Provider  docusate sodium (COLACE) 100 MG capsule Take 1 tablet once or twice daily as needed for constipation while taking narcotic pain medicine 08/05/21  Yes Hinda Kehr, MD  ondansetron (ZOFRAN-ODT) 4 MG disintegrating tablet Allow 1-2 tablets  to dissolve in your mouth every 8 hours as needed for nausea/vomiting 08/05/21  Yes Hinda Kehr, MD  oxyCODONE-acetaminophen (PERCOCET) 5-325 MG tablet Take 2 tablets by mouth every 8 (eight) hours as needed for severe pain. 08/05/21  Yes Hinda Kehr, MD  tamsulosin (FLOMAX) 0.4 MG CAPS capsule Take 1 tablet by mouth daily until you pass the kidney stone or no longer have symptoms 08/05/21  Yes Hinda Kehr, MD  amlodipine-olmesartan (AZOR) 10-20 MG tablet Take by mouth. 03/25/19   [provider]  aspirin EC 81 MG  tablet Take by mouth. 02/21/19   [provider]  cyclobenzaprine (FLEXERIL) 10 MG tablet Take 1 tablet (10 mg total) by mouth 2 (two) times daily as needed for muscle spasms. Do not drive while taking as can cause drowsiness 03/29/19   Marylene Land, NP  fexofenadine-pseudoephedrine (ALLEGRA-D 24) 180-240 MG 24 hr tablet Take by mouth.    [provider]  finasteride (PROSCAR) 5 MG tablet Take by mouth. 03/19/19   [provider]  gabapentin (NEURONTIN) 400 MG capsule Take by mouth. 08/08/18 07/13/19  [provider]  ibuprofen (ADVIL) 800 MG tablet Take 800 mg by mouth every 6 (six) hours as needed.  01/22/19   [provider]  metoprolol tartrate (LOPRESSOR) 100 MG tablet Take 1 tablet (100 mg total) by mouth once for 1 dose. Please take one time dose 100mg  metoprolol tartrate 2 hr prior to cardiac CT for HR control IF HR >55bpm. 05/18/20 05/18/20  Kate Sable, MD  predniSONE (DELTASONE) 10 MG tablet Start 60 mg po day one, then 50 mg po day two, taper by 10 mg daily until complete. 03/29/19   Marylene Land, NP  venlafaxine (EFFEXOR) 75 MG tablet Take by mouth. 06/18/18 04/20/19  [provider]    Allergies Patient has no known allergies.  Family History  Problem Relation Age of Onset   Cancer Father     Social History Social History   Tobacco Use   Smoking status: Former    Types: Cigarettes    Quit date: 07/09/2019    Years since quitting: 2.0   Smokeless tobacco: Never  Vaping Use   Vaping Use: Never used  Substance Use Topics   Alcohol use: Not Currently   Drug use: Never    Review of Systems Constitutional: No fever/chills Eyes: No visual changes. ENT: No sore throat. Cardiovascular: Denies chest pain. Respiratory: Denies shortness of breath. Gastrointestinal: Positive for left flank pain that radiates somewhat to the abdomen.  Positive for nausea and vomiting. Genitourinary: Negative for  dysuria. Musculoskeletal: Positive for left flank pain. Integumentary: Negative for rash. Neurological: Negative for headaches, focal weakness or numbness.   ____________________________________________   PHYSICAL EXAM:  VITAL SIGNS: ED Triage Vitals  Enc Vitals Group     BP 08/04/21 1842 135/68     Pulse Rate 08/04/21 1842 74     Resp 08/04/21 1842 19     Temp 08/04/21 1842 99.2 F (37.3 C)     Temp Source 08/04/21 1842 Oral     SpO2 08/04/21 1842 94 %     Weight 08/04/21 1843 133.8 kg (295 lb)     Height 08/04/21 1843 1.753 m (5\' 9" )     Head Circumference --      Peak Flow --      Pain Score 08/04/21 1843 6     Pain Loc --      Pain Edu? --      Excl. in [place]? --  Constitutional: Alert and oriented.  Eyes: Conjunctivae are normal.  Head: Atraumatic. Nose: No congestion/rhinnorhea. Mouth/Throat: Patient is wearing a mask. Neck: No stridor.  No meningeal signs.   Cardiovascular: Normal rate, regular rhythm. Good peripheral circulation. Respiratory: Normal respiratory effort.  No retractions. Gastrointestinal: Soft and nontender. No distention.  Musculoskeletal: Left flank discomfort and tenderness to percussion. Neurologic:  Normal speech and language. No gross focal neurologic deficits are appreciated.  Skin:  Skin is warm, dry and intact. Psychiatric: Mood and affect are normal. Speech and behavior are normal.  ____________________________________________   LABS (all labs ordered are listed, but only abnormal results are displayed)  Labs Reviewed  URINALYSIS, ROUTINE W REFLEX MICROSCOPIC - Abnormal; Notable for the following components:      Result Value   Color, Urine YELLOW (*)    APPearance CLEAR (*)    Ketones, ur 20 (*)    All other components within normal limits  BASIC METABOLIC PANEL - Abnormal; Notable for the following components:   Creatinine, Ser 1.60 (*)    Calcium 8.8 (*)    GFR, Estimated 48 (*)    All other components within normal  limits  CBC - Abnormal; Notable for the following components:   WBC 11.5 (*)    All other components within normal limits   ____________________________________________   RADIOLOGY  I reviewed the outpatient CT scan report in care everywhere and it confirmed a 2 mm obstructive distal left ureteral stone as well as some mild perinephric stranding.  ____________________________________________   INITIAL IMPRESSION / MDM / ASSESSMENT AND PLAN / ED COURSE  As part of my medical decision making, I reviewed the following data within the [facility] notes reviewed and incorporated, Labs reviewed , Old chart reviewed, Notes from prior ED visits, and [place] Controlled Substance Database   Differential diagnosis includes, but is not limited to, ureteral stone, renal colic, obstructive stone, infected stone.  Patient is well-appearing and in no distress.  Vital signs are stable and within normal limits.  CBC is essentially normal with only very slight leukocytosis of 11.5.  His basic metabolic panel is notable for an elevation of his creatinine at 1.6 which is likely related to volume depletion in the setting of feeling acutely ill and not eating or drinking as much as usual.  His urinalysis is reassuring with a few ketones but no evidence of infection.  I reviewed the CT scan from care everywhere which confirmed a 2 mm distal left ureteral stone with obstructive changes including some mild perinephric stranding which was probably the source of concern for the outpatient provider.  However his urinalysis is reassuring and he is showing no evidence of acute infection and certainly not of sepsis.  It is theoretically possible that he could have a ureteral infection or pyelonephritis proximal to the small obstructing stone, but I think that is very unlikely given his overall well appearance.  I talked about this with the patient and his wife and we agreed on the plan for pain  management and outpatient follow-up with urology in [place].  I gave strict return precautions.  I also offered to have an IV placed and give him IV fluids but he would prefer to rehydrate himself by mouth and I think that is appropriate.  However I told him about his acute kidney injury/dehydration and strongly encouraged him to follow-up.           ____________________________________________  FINAL CLINICAL IMPRESSION(S) / ED DIAGNOSES  Final  diagnoses:  Ureteral colic  Kidney stone  Left ureteral stone  Acute kidney injury ([place])     MEDICATIONS GIVEN DURING THIS VISIT:  Medications  oxyCODONE-acetaminophen (PERCOCET/ROXICET) 5-325 MG per tablet 2 tablet (2 tablets Oral Given 08/05/21 0248)  ondansetron (ZOFRAN-ODT) disintegrating tablet 4 mg (4 mg Oral Given 08/05/21 0247)     ED Discharge Orders          Ordered    oxyCODONE-acetaminophen (PERCOCET) 5-325 MG tablet  Every 8 hours PRN        08/05/21 0253    ondansetron (ZOFRAN-ODT) 4 MG disintegrating tablet        08/05/21 0253    tamsulosin (FLOMAX) 0.4 MG CAPS capsule        08/05/21 0253    docusate sodium (COLACE) 100 MG capsule        08/05/21 0253             Note:  This document was prepared using Dragon voice recognition software and may include unintentional dictation errors.   Hinda Kehr, MD 08/05/21 [phone]

## 2021-08-05 NOTE — ED Notes (Signed)
E-signature pad unavailable - Pt verbalized understanding of D/C information - no additional concerns at this time.  

## 2021-08-05 NOTE — ED Notes (Signed)
Pt ambulatory with crutches to the ED stretcher - Pt declined a wheelchair.

## 2022-05-09 ENCOUNTER — Ambulatory Visit
Admission: EM | Admit: 2022-05-09 | Discharge: 2022-05-09 | Disposition: A | Discharge status: Home / Self Care | Payer: BC Managed Care – PPO | Attending: Emergency Medicine | Admitting: Emergency Medicine

## 2022-05-09 DIAGNOSIS — L03113 Cellulitis of right upper limb: Secondary | ICD-10-CM

## 2022-05-09 LAB — COMPREHENSIVE METABOLIC PANEL
ALT: 26 U/L (ref 0–44)
AST: 29 U/L (ref 15–41)
Albumin: 4.1 g/dL (ref 3.5–5.0)
Alkaline Phosphatase: 68 U/L (ref 38–126)
Anion gap: 8 (ref 5–15)
BUN: 19 mg/dL (ref 8–23)
CO2: 24 mmol/L (ref 22–32)
Calcium: 8.6 mg/dL — ABNORMAL LOW (ref 8.9–10.3)
Chloride: 105 mmol/L (ref 98–111)
Creatinine, Ser: 0.94 mg/dL (ref 0.61–1.24)
GFR, Estimated: >60 mL/min (ref >60)
Glucose, Bld: 98 mg/dL (ref 70–99)
Potassium: 3.9 mmol/L (ref 3.5–5.1)
Sodium: 137 mmol/L (ref 135–145)
Total Bilirubin: 1 mg/dL (ref 0.3–1.2)
Total Protein: 6.9 g/dL (ref 6.5–8.1)

## 2022-05-09 LAB — CBC WITH DIFFERENTIAL/PLATELET
Abs Immature Granulocytes: 0.08 10*3/uL — ABNORMAL HIGH (ref 0.00–0.07)
Basophils Absolute: 0 10*3/uL (ref 0.0–0.1)
Basophils Relative: 0 %
Eosinophils Absolute: 0.4 10*3/uL (ref 0.0–0.5)
Eosinophils Relative: 3 %
HCT: 39.8 % (ref 39.0–52.0)
Hemoglobin: 13.1 g/dL (ref 13.0–17.0)
Immature Granulocytes: 1 %
Lymphocytes Relative: 9 %
Lymphs Abs: 1.3 10*3/uL (ref 0.7–4.0)
MCH: 29.2 pg (ref 26.0–34.0)
MCHC: 32.9 g/dL (ref 30.0–36.0)
MCV: 88.6 fL (ref 80.0–100.0)
Monocytes Absolute: 1.1 10*3/uL — ABNORMAL HIGH (ref 0.1–1.0)
Monocytes Relative: 8 %
Neutro Abs: 11.8 10*3/uL — ABNORMAL HIGH (ref 1.7–7.7)
Neutrophils Relative %: 79 %
Platelets: 286 10*3/uL (ref 150–400)
RBC: 4.49 MIL/uL (ref 4.22–5.81)
RDW: 13.2 % (ref 11.5–15.5)
WBC: 14.7 10*3/uL — ABNORMAL HIGH (ref 4.0–10.5)
nRBC: 0 % (ref 0.0–0.2)

## 2022-05-09 MED ORDER — DOXYCYCLINE HYCLATE 100 MG PO CAPS: 100.0000 mg | ORAL_CAPSULE | Freq: Two times a day (BID) | ORAL | 0 refills | Status: AC

## 2022-05-09 MED ORDER — SODIUM CHLORIDE 0.9 % IV BOLUS
1000.0000 mL | Freq: Once | INTRAVENOUS | Status: AC
  Administered 2022-05-09: 1000 mL via INTRAVENOUS

## 2022-05-09 NOTE — Discharge Instructions (Addendum)
Your labs today were all very reassuring but they did show that you have an infectious process going on.  Your exam is consistent with cellulitis in your right forearm.  Take the Doxycycline twice daily with food for 10 days.  Doxycycline will make you more sensitive to sunburn so wear sunscreen when outdoors and reapply it every 90 minutes.  Apply warm compresses to help promote drainage.  Use OTC Tylenol and Ibuprofen according to the package instructions as needed for pain.  Need to notify your orthopedic surgeon that you are currently being treated for cellulitis and let him guide the timeframe of your knee replacement going forward.  If you have any increase in redness of your right arm, swelling, redness extending up your arm, fever, nausea or vomiting and are unable to keep down fluids or medications then you need to go to the emergency department for evaluation.  Also if you have any increase in dizziness or any fainting you need to go to the emergency department.

## 2022-05-09 NOTE — ED Triage Notes (Signed)
Pt reports Sunday he had a sharp pain in elbow and now pain and swelling, muscle ache all over, dizziness, redness, hotness in right shoulder, believed he have a spider bite.

## 2022-05-09 NOTE — ED Provider Notes (Signed)
[facility]    CSN: [id number] Arrival date & time: 05/09/22  1130      History   Chief Complaint No chief complaint on file.   HPI Darrell Rios is a 66 y.o. male.   HPI  66 year old male here for evaluation of right elbow pain and swelling.  Patient reports that he felt a sharp pain in his elbow on Sunday and later that evening he felt what might be a pustule.  He squeezed the pustule and got some "oily" fluid out but no pus.  He states that yesterday he woke up and his arm was red and swollen.  He states that it is also hot.  Today he is experiencing dizziness and some significant fatigue.  He does endorse feeling some mild fatigue yesterday as well.  He states that he feels heat radiating all the way up to his right shoulder.  Patient denies any known injury.  He thinks he may have been bitten by a spider but he does not remember feeling a bite.  He also denies fever.  Past Medical History:  Diagnosis Date   COVID-19 04/04/2020   asymptomatic COVID   Hypertension    Sleep apnea     Patient Active Problem List   Diagnosis Date Noted   Carpal tunnel syndrome of right wrist 08/09/2020   Colon polyps 08/09/2020   OSA (obstructive sleep apnea) 08/09/2020   Prostatitis 08/09/2020   Ascending aortic aneurysm ([place]) 08/09/2020   Lymphedema 08/09/2020   Arthritis of right acromioclavicular joint 04/14/2020   Tear of right rotator cuff 04/14/2020   Shortness of breath 01/22/2020   Conductive hearing loss of right ear with unrestricted hearing of left ear 08/11/2019   Left otitis media with effusion 08/11/2019   Primary osteoarthritis of right knee 01/10/2019   BMI 40.0-44.9, adult ([place]) 08/19/2018   Lipoma of scalp 07/17/2018   Trochanteric bursitis of both hips 07/17/2018   Strain of quadriceps tendon 04/29/2018   Right elbow pain 02/12/2018   Dysfunction of left eustachian tube 02/11/2018   Ulnar neuropathy of both upper extremities 01/30/2018   Essential  hypertension 10/19/2017   Allergic rhinitis 11/03/2015   Chronic nasal congestion 09/22/2015   Increased urinary frequency 07/19/2015   BPH (benign prostatic hyperplasia) 06/20/2013   Cervical spinal stenosis 06/20/2013   GERD (gastroesophageal reflux disease) 06/20/2013   Elevated blood pressure 06/16/2013   Vitamin D deficiency 06/16/2013   H/O hematuria 06/12/2013   Biceps tendonitis 04/04/2013   Golfer's elbow 04/04/2013   Neck pain 04/04/2013   Ulnar nerve impingement 04/04/2013    Past Surgical History:  Procedure Laterality Date   CHOLECYSTECTOMY     HERNIA REPAIR     JOINT REPLACEMENT     NECK SURGERY     TONSILLECTOMY         Home Medications    Prior to Admission medications   Medication Sig Start Date End Date Taking? Authorizing Provider  amLODipine-olmesartan (AZOR) 5-20 MG tablet Take 1 tablet by mouth daily. 04/28/21  Yes [provider]  atorvastatin (LIPITOR) 10 MG tablet Take 10 mg by mouth daily.   Yes [provider]  doxycycline (VIBRAMYCIN) 100 MG capsule Take 1 capsule (100 mg total) by mouth 2 (two) times daily. 05/09/22  Yes Margarette Canada, NP  amlodipine-olmesartan (AZOR) 10-20 MG tablet Take by mouth. 03/25/19   [provider]  aspirin EC 81 MG tablet Take by mouth. 02/21/19   [provider]  docusate sodium (COLACE) 100  MG capsule Take 1 tablet once or twice daily as needed for constipation while taking narcotic pain medicine 08/05/21   Hinda Kehr, MD  finasteride (PROSCAR) 5 MG tablet Take by mouth. 03/19/19   [provider]  ibuprofen (ADVIL) 800 MG tablet Take 800 mg by mouth every 6 (six) hours as needed.  01/22/19   [provider]  meloxicam (MOBIC) 15 MG tablet Take 15 mg by mouth daily. 04/13/22   [provider]  oxyCODONE-acetaminophen (PERCOCET) 5-325 MG tablet Take 2 tablets by mouth every 8 (eight) hours as needed for severe pain. 08/05/21   Hinda Kehr, MD  tamsulosin  (FLOMAX) 0.4 MG CAPS capsule Take 1 tablet by mouth daily until you pass the kidney stone or no longer have symptoms 08/05/21   Hinda Kehr, MD    Family History Family History  Problem Relation Age of Onset   Cancer Father     Social History Social History   Tobacco Use   Smoking status: Former    Types: Cigarettes    Quit date: 07/09/2019    Years since quitting: 2.8   Smokeless tobacco: Never  Vaping Use   Vaping Use: Never used  Substance Use Topics   Alcohol use: Yes    Alcohol/week: 1.0 standard drink of alcohol    Types: 1 Glasses of wine per week   Drug use: Never     Allergies   Patient has no known allergies.   Review of Systems Review of Systems  Constitutional:  Positive for fatigue. Negative for fever.  Musculoskeletal:  Positive for arthralgias and myalgias.  Skin:  Positive for color change.  Neurological:  Positive for dizziness.  Hematological: Negative.   Psychiatric/Behavioral: Negative.       Physical Exam Triage Vital Signs ED Triage Vitals  Enc Vitals Group     BP 05/09/22 1326 (!) 100/59     Pulse Rate 05/09/22 1326 79     Resp 05/09/22 1326 18     Temp 05/09/22 1326 98.2 F (36.8 C)     Temp Source 05/09/22 1326 Oral     SpO2 05/09/22 1326 99 %     Weight 05/09/22 1327 265 lb (120.2 kg)     Height 05/09/22 1327 '5\' 9"'$  (1.753 m)     Head Circumference --      Peak Flow --      Pain Score 05/09/22 1324 7     Pain Loc --      Pain Edu? --      Excl. in [place]? --    No data found.  Updated Vital Signs BP (!) 100/59 (BP Location: Left Arm)   Pulse 79   Temp 98.2 F (36.8 C) (Oral)   Resp 18   Ht '5\' 9"'$  (1.753 m)   Wt 265 lb (120.2 kg)   SpO2 99%   BMI 39.13 kg/m   Visual Acuity Right Eye Distance:   Left Eye Distance:   Bilateral Distance:    Right Eye Near:   Left Eye Near:    Bilateral Near:     Physical Exam Vitals and nursing note reviewed.  Constitutional:      Appearance: Normal appearance. He is  ill-appearing.  HENT:     Head: Normocephalic and atraumatic.  Cardiovascular:     Rate and Rhythm: Normal rate and regular rhythm.     Pulses: Normal pulses.     Heart sounds: Normal heart sounds. No murmur heard.    No friction rub.  No gallop.  Pulmonary:     Effort: Pulmonary effort is normal.     Breath sounds: Normal breath sounds. No wheezing, rhonchi or rales.  Musculoskeletal:        General: Swelling and tenderness present. No deformity or signs of injury.  Skin:    General: Skin is warm.     Capillary Refill: Capillary refill takes less than 2 seconds.     Findings: Erythema present. No lesion.  Neurological:     General: No focal deficit present.     Mental Status: He is alert and oriented to person, place, and time.  Psychiatric:        Mood and Affect: Mood normal.        Behavior: Behavior normal.        Thought Content: Thought content normal.        Judgment: Judgment normal.      UC Treatments / Results  Labs (all labs ordered are listed, but only abnormal results are displayed) Labs Reviewed  CBC WITH DIFFERENTIAL/PLATELET - Abnormal; Notable for the following components:      Result Value   WBC 14.7 (*)    Neutro Abs 11.8 (*)    Monocytes Absolute 1.1 (*)    Abs Immature Granulocytes 0.08 (*)    All other components within normal limits  COMPREHENSIVE METABOLIC PANEL - Abnormal; Notable for the following components:   Calcium 8.6 (*)    All other components within normal limits    EKG   Radiology No results found.  Procedures Procedures (including critical care time)  Medications Ordered in UC Medications  sodium chloride 0.9 % bolus 1,000 mL (1,000 mLs Intravenous New Bag/Given 05/09/22 1446)    Initial Impression / Assessment and Plan / UC Course  I have reviewed the triage vital signs and the nursing notes.  Pertinent labs & imaging results that were available during my care of the patient were reviewed by me and considered in my  medical decision making (see chart for details).   Patient is a pleasant, though mildly ill-appearing, 66 year old male here for evaluation of redness, pain, and swelling of his right elbow and forearm.  The redness extends part the way up his right upper arm as well.  He states that this started out as an area of swelling on the elbow that he thought might be a pocket of fluid.  He squeezed it and got some [place] fluid out.  In the ensuing timeframe he has developed redness and swelling to his right elbow, distal upper arm, and right forearm that extends down to his wrist.  The arm is tender to touch and hot.  Radial and ulnar pulses are 2+.  There is no induration or fluctuance noted.  Patient is also complaining of fatigue, severe body aches, and dizziness.      Patient also denies any injury or trauma.  Patient does have a left knee replacement and is scheduled to have his right knee replaced next week.  Given his constitutional complaints I am concerned for systemic involvement.  I will check a CBC, CMP, and will give the patient a bolus of fluid and see if it improves his dizziness.  CBC shows a WBC count of 14.7 with neutrophil number of 11.8.  Monocytes are also slightly elevated at 1.1.  H&H is 13.1 and 39.8 respectively and platelets are 286.  CMP shows a mildly decreased calcium of 8.6 but the electrolytes, renal function, and transaminases are all normal.  Patient reports that he is feeling better with the IV fluids but he still has some mild dizziness.  I will discharge him home with a diagnosis of cellulitis and treat him with doxycycline twice daily for 10 days.  I have advised him that he needs to advise his orthopedic surgeon that he is being treated for cellulitis.  I also advised him that if he develops any increased dizziness, fevers, increased redness or swelling, nausea, or vomiting that needs to go to the ER for evaluation.   Final Clinical Impressions(s) / UC Diagnoses    Final diagnoses:  Cellulitis of right upper extremity     Discharge Instructions      Your labs today were all very reassuring but they did show that you have an infectious process going on.  Your exam is consistent with cellulitis in your right forearm.  Take the Doxycycline twice daily with food for 10 days.  Doxycycline will make you more sensitive to sunburn so wear sunscreen when outdoors and reapply it every 90 minutes.  Apply warm compresses to help promote drainage.  Use OTC Tylenol and Ibuprofen according to the package instructions as needed for pain.  Need to notify your orthopedic surgeon that you are currently being treated for cellulitis and let him guide the timeframe of your knee replacement going forward.  If you have any increase in redness of your right arm, swelling, redness extending up your arm, fever, nausea or vomiting and are unable to keep down fluids or medications then you need to go to the emergency department for evaluation.  Also if you have any increase in dizziness or any fainting you need to go to the emergency department.     ED Prescriptions     Medication Sig Dispense Auth. Provider   doxycycline (VIBRAMYCIN) 100 MG capsule Take 1 capsule (100 mg total) by mouth 2 (two) times daily. 20 capsule Margarette Canada, NP      PDMP not reviewed this encounter.   Margarette Canada, NP 05/09/22 [phone]

## 2022-07-03 ENCOUNTER — Encounter (INDEPENDENT_AMBULATORY_CARE_PROVIDER_SITE_OTHER): Payer: Self-pay

## 2022-08-08 ENCOUNTER — Other Ambulatory Visit (INDEPENDENT_AMBULATORY_CARE_PROVIDER_SITE_OTHER): Payer: Self-pay | Admitting: Vascular Surgery

## 2022-08-08 DIAGNOSIS — I7121 Aneurysm of the ascending aorta, without rupture: Secondary | ICD-10-CM

## 2022-08-09 NOTE — Progress Notes (Signed)
MRN : [id number]  Darrell Rios is a 66 y.o. (1956/04/29) male who presents with chief complaint of legs swell.  History of Present Illness:   Patient is seen for evaluation of  Two issues: 1.leg swelling and 2. An ascending TAA noted on CT scan.   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 Patient denies abdominal pain, chest pain or back pain, no other abdominal complaints. No changes suggesting embolic episodes.   There have been no interval changes in the patient's overall health care since his last visit.  Patient denies amaurosis fugax or TIA symptoms. There is no history of claudication or rest pain symptoms of the lower extremities. The patient denies angina or shortness of breath.   The patient is also followed for leg swelling. The swelling is associated with pain and discoloration. The patient notes that in the morning the legs are significantly improved but they steadily worsened throughout the course of the day. Elevation makes the legs better, dependency makes them much worse.    There is no history of past or interval ulcerations associated with the swelling.    The patient denies any recent changes in their medications.   The patient has been wearing graduated compression on a routine basis for two years (that is more than 100 weeks).   The patient has no had any past angiography, interventions or vascular surgery.    The patient denies a history of DVT or PE. There is no prior history of phlebitis.   There is no history of radiation treatment to the groin or pelvis No history of malignancies. No history of trauma or groin or pelvic surgery. No history of foreign travel or parasitic infections area   Previous coronary CT was performed 05/20/2020.  This showed an incidental 4.8 cm ascending TAA.  He denies chest or back pain.  Duplex US of the aorta and iliac arteries  done today shows the abdominal aorta measures 2.5 cm.  No outpatient medications have been marked as taking for the 08/10/22 encounter (Appointment) with Delana Meyer, Dolores Lory, MD.    Past Medical History:  Diagnosis Date   COVID-19 04/04/2020   asymptomatic COVID   Hypertension    Sleep apnea     Past Surgical History:  Procedure Laterality Date   CHOLECYSTECTOMY     HERNIA REPAIR     JOINT REPLACEMENT     NECK SURGERY     TONSILLECTOMY      Social History Social History   Tobacco Use   Smoking status: Former    Types: Cigarettes    Quit date: 07/09/2019    Years since quitting: 3.0   Smokeless tobacco: Never  Vaping Use   Vaping Use: Never used  Substance Use Topics   Alcohol use: Yes    Alcohol/week: 1.0 standard drink of alcohol    Types: 1 Glasses of wine per week   Drug use: Never    Family History Family History  Problem Relation Age of Onset   Cancer Father     No Known Allergies   REVIEW OF SYSTEMS (Negative unless checked)  Constitutional: '[]'$ Weight loss  '[]'$ Fever  '[]'$ Chills Cardiac: '[]'$ Chest pain   '[]'$ Chest pressure   '[]'$ Palpitations   '[]'$ Shortness of breath when laying flat   '[]'$ Shortness of breath with exertion. Vascular:  '[]'$ Pain in legs with walking   '[x]'$ Pain in legs with standing  '[]'$ History of DVT   '[]'$   Phlebitis   '[x]'$ Swelling in legs   '[]'$ Varicose veins   '[]'$ Non-healing ulcers Pulmonary:   '[]'$ Uses home oxygen   '[]'$ Productive cough   '[]'$ Hemoptysis   '[]'$ Wheeze  '[]'$ COPD   '[]'$ Asthma Neurologic:  '[]'$ Dizziness   '[]'$ Seizures   '[]'$ History of stroke   '[]'$ History of TIA  '[]'$ Aphasia   '[]'$ Vissual changes   '[]'$ Weakness or numbness in arm   '[]'$ Weakness or numbness in leg Musculoskeletal:   '[]'$ Joint swelling   '[x]'$ Joint pain   '[]'$ Low back pain Hematologic:  '[]'$ Easy bruising  '[]'$ Easy bleeding   '[]'$ Hypercoagulable state   '[]'$ Anemic Gastrointestinal:  '[]'$ Diarrhea   '[]'$ Vomiting  '[x]'$ Gastroesophageal reflux/heartburn   '[]'$ Difficulty swallowing. Genitourinary:  '[]'$ Chronic kidney disease   '[]'$ Difficult  urination  '[]'$ Frequent urination   '[]'$ Blood in urine Skin:  '[]'$ Rashes   '[]'$ Ulcers  Psychological:  '[]'$ History of anxiety   '[]'$  History of major depression.  Physical Examination  There were no vitals filed for this visit. There is no height or weight on file to calculate BMI. Gen: WD/WN, NAD Head: /AT, No temporalis wasting.  Ear/Nose/Throat: Hearing grossly intact, nares w/o erythema or drainage, pinna without lesions Eyes: PER, EOMI, sclera nonicteric.  Neck: Supple, no gross masses.  No JVD.  Pulmonary:  Good air movement, no audible wheezing, no use of accessory muscles.  Cardiac: RRR, precordium not hyperdynamic. Vascular:  scattered varicosities present bilaterally.  Mild venous stasis changes to the legs bilaterally.  2-3+ soft pitting edema, CEAP [id number]  Vessel Right Left  Radial Palpable Palpable  Gastrointestinal: soft, non-distended. No guarding/no peritoneal signs.  Musculoskeletal: M/S 5/5 throughout.  No deformity.  Neurologic: CN 2-12 intact. Pain and light touch intact in extremities.  Symmetrical.  Speech is fluent. Motor exam as listed above. Psychiatric: Judgment intact, Mood & affect appropriate for pt's clinical situation. Dermatologic: Venous rashes no ulcers noted.  No changes consistent with cellulitis. Lymph : No lichenification or skin changes of chronic lymphedema.  CBC Lab Results  Component Value Date   WBC 14.7 (H) 05/09/2022   HGB 13.1 05/09/2022   HCT 39.8 05/09/2022   MCV 88.6 05/09/2022   PLT 286 05/09/2022    BMET    Component Value Date/Time   NA 137 05/09/2022 1442   K 3.9 05/09/2022 1442   CL 105 05/09/2022 1442   CO2 24 05/09/2022 1442   GLUCOSE 98 05/09/2022 1442   BUN 19 05/09/2022 1442   CREATININE 0.94 05/09/2022 1442   CALCIUM 8.6 (L) 05/09/2022 1442   GFRNONAA >60 05/09/2022 1442   CrCl cannot be calculated (Patient's most recent lab result is older than the maximum 21 days allowed.).  COAG No results found for: "INR",  "PROTIME"  Radiology No results found.   Assessment/Plan 1. Aneurysm of ascending aorta without rupture (HCC) Recommend:  The patient has an asymptomatic thoracic aortic aneurysm that is less than 6.0 cm in maximal diameter.  I have discussed the natural history of thoracic aortic aneurysm and the small risk of rupture for aneurysm less than 6.5 cm in size.  However, as these small aneurysms tend to enlarge over time, continued surveillance with CT scan is mandatory.  However, he did not have his follow up scan as the EMR does not allow this to be ordered at 2 year intervals.  I have also discussed optimizing medical management with hypertension and lipid control and the importance of abstinence from tobacco.  The patient is also encouraged to exercise a minimum of 30 minutes 4 times a week.   Should the patient  develop new onset chest or back pain or signs of peripheral embolization they are instructed to seek medical attention immediately and to alert the physician providing care that they have an aneurysm in the chest.   The patient voices their understanding.  The patient will return as ordered with a CT scan of the chest within the month - CT CHEST WO CONTRAST; Future  2. Abdominal aortic ectasia (HCC) Recommend: No surgery or intervention is indicated at this time.  The patient has an asymptomatic abdominal aortic aneurysm that is less than 4 cm in maximal diameter.    I have reviewed the natural history of abdominal aortic aneurysm and the small risk of rupture for aneurysm less than 5 cm in size.  However, as these small aneurysms tend to enlarge over time, continued surveillance with ultrasound or CT scan is mandatory.   I have also discussed optimizing medical management with hypertension and lipid control and the importance of abstinence from tobacco.  The patient is also encouraged to exercise a minimum of 30 minutes 4 times a week.   Should the patient develop new onset  abdominal or back pain or signs of peripheral embolization they are instructed to seek medical attention immediately and to alert the physician providing care that they have an aneurysm.   The patient voices their understanding.  The patient will return in 24 months with an aortic duplex regarding this issue.  3. Lymphedema Recommend:  No surgery or intervention at this point in time.  I have reviewed my discussion with the patient regarding venous insufficiency and why it causes symptoms. I have discussed with the patient the chronic skin changes that accompany venous insufficiency and the long term sequela such as ulceration. Patient will contnue wearing graduated compression stockings on a daily basis, as this has provided excellent control of his edema. The patient will put the stockings on first thing in the morning and removing them in the evening. The patient is reminded not to sleep in the stockings.  In addition, behavioral modification including elevation during the day will be initiated. Exercise is strongly encouraged.  Previous duplex ultrasound of the lower extremities shows normal deep system, no significant superficial reflux was identified.  Given the patient's good control and lack of any problems regarding the venous insufficiency and lymphedema a lymph pump in not need at this time.    4. Essential hypertension Continue antihypertensive medications as already ordered, these medications have been reviewed and there are no changes at this time.  5. Gastroesophageal reflux disease, unspecified whether esophagitis present Continue PPI as already ordered, this medication has been reviewed and there are no changes at this time.  Avoidence of caffeine and alcohol  Moderate elevation of the head of the bed     Hortencia Pilar, MD  08/09/2022 9:09 AM

## 2022-08-10 ENCOUNTER — Ambulatory Visit (INDEPENDENT_AMBULATORY_CARE_PROVIDER_SITE_OTHER): Payer: BC Managed Care – PPO | Admitting: Vascular Surgery

## 2022-08-10 ENCOUNTER — Encounter (INDEPENDENT_AMBULATORY_CARE_PROVIDER_SITE_OTHER): Payer: Self-pay | Admitting: Vascular Surgery

## 2022-08-10 ENCOUNTER — Ambulatory Visit (INDEPENDENT_AMBULATORY_CARE_PROVIDER_SITE_OTHER): Payer: BC Managed Care – PPO

## 2022-08-10 VITALS — BP 112/68 | HR 90 | Resp 16 | Wt 265.0 lb

## 2022-08-10 DIAGNOSIS — K219 Gastro-esophageal reflux disease without esophagitis: Secondary | ICD-10-CM | POA: Diagnosis not present

## 2022-08-10 DIAGNOSIS — I10 Essential (primary) hypertension: Secondary | ICD-10-CM | POA: Diagnosis not present

## 2022-08-10 DIAGNOSIS — I77811 Abdominal aortic ectasia: Secondary | ICD-10-CM

## 2022-08-10 DIAGNOSIS — I890 Lymphedema, not elsewhere classified: Secondary | ICD-10-CM | POA: Diagnosis not present

## 2022-08-10 DIAGNOSIS — I7121 Aneurysm of the ascending aorta, without rupture: Secondary | ICD-10-CM | POA: Diagnosis not present

## 2022-08-13 ENCOUNTER — Encounter (INDEPENDENT_AMBULATORY_CARE_PROVIDER_SITE_OTHER): Payer: Self-pay | Admitting: Vascular Surgery

## 2022-08-13 DIAGNOSIS — I77811 Abdominal aortic ectasia: Secondary | ICD-10-CM | POA: Insufficient documentation

## 2022-08-29 ENCOUNTER — Ambulatory Visit
Admission: RE | Admit: 2022-08-29 | Discharge: 2022-08-29 | Disposition: A | Discharge status: Home / Self Care | Payer: BC Managed Care – PPO | Source: Ambulatory Visit | Attending: Vascular Surgery | Admitting: Vascular Surgery

## 2022-08-29 DIAGNOSIS — I7121 Aneurysm of the ascending aorta, without rupture: Secondary | ICD-10-CM | POA: Insufficient documentation

## 2022-09-06 ENCOUNTER — Encounter (INDEPENDENT_AMBULATORY_CARE_PROVIDER_SITE_OTHER): Payer: Self-pay | Admitting: Vascular Surgery

## 2022-09-19 NOTE — Progress Notes (Deleted)
MRN : [id number]  Darrell Rios is a 67 y.o. (04-Jun-1956) male who presents with chief complaint of check circulation.  History of Present Illness:   The patient returns to the office for surveillance of a known thoracic aortic aneurysm in association with abdominal aortic ectasia. Patient denies abdominal pain, chest pain or back pain, no other abdominal complaints. No changes suggesting embolic episodes.    There have been no interval changes in the patient's overall health care since his last visit.   Patient denies amaurosis fugax or TIA symptoms. There is no history of claudication or rest pain symptoms of the lower extremities. The patient denies angina or shortness of breath.    The patient is also followed for leg swelling. The swelling is associated with pain and discoloration. The patient notes that in the morning the legs are significantly improved but they steadily worsened throughout the course of the day. Elevation makes the legs better, dependency makes them much worse.    There is no history of past or interval ulcerations associated with the swelling.    The patient denies any recent changes in their medications.   The patient has been wearing graduated compression on a routine basis for two years (that is more than 100 weeks).   The patient has no had any past angiography, interventions or vascular surgery.    The patient denies a history of DVT or PE. There is no prior history of phlebitis.   There is no history of radiation treatment to the groin or pelvis No history of malignancies. No history of trauma or groin or pelvic surgery. No history of foreign travel or parasitic infections area    Previous coronary CT was performed 05/20/2020.  This showed an incidental 4.8 cm ascending TAA.  He denies chest or back pain.  CT scan of the chest dated 08/30/2022 shows an ascending TAA that measures 4.7 cm.  No change compared to previous scan.    Duplex US of the aorta and iliac arteries done today shows the abdominal aorta measures 2.5 cm.  No outpatient medications have been marked as taking for the 09/21/22 encounter (Appointment) with Delana Meyer, Dolores Lory, MD.    Past Medical History:  Diagnosis Date   COVID-19 04/04/2020   asymptomatic COVID   Hypertension    Sleep apnea     Past Surgical History:  Procedure Laterality Date   CHOLECYSTECTOMY     HERNIA REPAIR     JOINT REPLACEMENT     NECK SURGERY     TONSILLECTOMY      Social History Social History   Tobacco Use   Smoking status: Former    Types: Cigarettes    Quit date: 07/09/2019    Years since quitting: 3.2   Smokeless tobacco: Never  Vaping Use   Vaping Use: Never used  Substance Use Topics   Alcohol use: Yes    Alcohol/week: 1.0 standard drink of alcohol    Types: 1 Glasses of wine per week   Drug use: Never    Family History Family History  Problem Relation Age of Onset   Cancer Father     No Known Allergies   REVIEW OF SYSTEMS (Negative unless checked)  Constitutional: []$ Weight loss  []$ Fever  []$ Chills Cardiac: []$ Chest pain   []$ Chest pressure   []$ Palpitations   []$ Shortness of breath when laying flat   []$ Shortness of breath with exertion. Vascular:  [x]$ Pain  in legs with walking   []$ Pain in legs at rest  []$ History of DVT   []$ Phlebitis   []$ Swelling in legs   []$ Varicose veins   []$ Non-healing ulcers Pulmonary:   []$ Uses home oxygen   []$ Productive cough   []$ Hemoptysis   []$ Wheeze  []$ COPD   []$ Asthma Neurologic:  []$ Dizziness   []$ Seizures   []$ History of stroke   []$ History of TIA  []$ Aphasia   []$ Vissual changes   [x]$ Weakness or numbness in arm   []$ Weakness or numbness in leg Musculoskeletal:   []$ Joint swelling   []$ Joint pain   []$ Low back pain Hematologic:  []$ Easy bruising  []$ Easy bleeding   []$ Hypercoagulable state   []$ Anemic Gastrointestinal:  []$ Diarrhea   []$ Vomiting  [x]$ Gastroesophageal reflux/heartburn   []$ Difficulty swallowing. Genitourinary:   []$ Chronic kidney disease   []$ Difficult urination  []$ Frequent urination   []$ Blood in urine Skin:  []$ Rashes   []$ Ulcers  Psychological:  []$ History of anxiety   []$  History of major depression.  Physical Examination  There were no vitals filed for this visit. There is no height or weight on file to calculate BMI. Gen: WD/WN, NAD Head: [place]/AT, No temporalis wasting.  Ear/Nose/Throat: Hearing grossly intact, nares w/o erythema or drainage Eyes: PER, EOMI, sclera nonicteric.  Neck: Supple, no masses.  No bruit or JVD.  Pulmonary:  Good air movement, no audible wheezing, no use of accessory muscles.  Cardiac: RRR, normal S1, S2, no Murmurs. Vascular:  mild trophic changes, no open wounds Vessel Right Left  Radial Palpable Palpable  PT Not Palpable Not Palpable  DP Not Palpable Not Palpable  Gastrointestinal: soft, non-distended. No guarding/no peritoneal signs.  Musculoskeletal: M/S 5/5 throughout.  No visible deformity.  Neurologic: CN 2-12 intact. Pain and light touch intact in extremities.  Symmetrical.  Speech is fluent. Motor exam as listed above. Psychiatric: Judgment intact, Mood & affect appropriate for pt's clinical situation. Dermatologic: No rashes or ulcers noted.  No changes consistent with cellulitis.   CBC Lab Results  Component Value Date   WBC 14.7 (H) 05/09/2022   HGB 13.1 05/09/2022   HCT 39.8 05/09/2022   MCV 88.6 05/09/2022   PLT 286 05/09/2022    BMET    Component Value Date/Time   NA 137 05/09/2022 1442   K 3.9 05/09/2022 1442   CL 105 05/09/2022 1442   CO2 24 05/09/2022 1442   GLUCOSE 98 05/09/2022 1442   BUN 19 05/09/2022 1442   CREATININE 0.94 05/09/2022 1442   CALCIUM 8.6 (L) 05/09/2022 1442   GFRNONAA >60 05/09/2022 1442   CrCl cannot be calculated (Patient's most recent lab result is older than the maximum 21 days allowed.).  COAG No results found for: "INR", "PROTIME"  Radiology CT CHEST WO CONTRAST  Result Date: 08/30/2022 CLINICAL DATA:   Preop planning.  Aortic aneurysm. EXAM: CT CHEST WITHOUT CONTRAST TECHNIQUE: Multidetector CT imaging of the chest was performed following the standard protocol without IV contrast. RADIATION DOSE REDUCTION: This exam was performed according to the departmental dose-optimization program which includes automated exposure control, adjustment of the mA and/or kV according to patient size and/or use of iterative reconstruction technique. COMPARISON:  Cardiac CT, 05/20/2020. FINDINGS: Cardiovascular: Heart normal in size and configuration. No pericardial effusion. No coronary artery calcifications. Ascending thoracic aorta measures 4.7 cm in diameter, without change from the prior CT allowing for differences in measurement technique. Aorta measures 4.5 cm at the sino-tubular junction, 3.1 cm along the distal arch distal to the left subclavian artery origin and up to 2.6  cm along the descending portion. Mild thoracic aortic atherosclerotic calcifications. Mediastinum/Nodes: No enlarged mediastinal or axillary lymph nodes. Thyroid gland, trachea, and esophagus demonstrate no significant findings. Lungs/Pleura: 4 mm subpleural nodule, right lower lobe, image 115, series 3. 3 mm subpleural nodule, right upper lobe, image 43, series 3. 3 mm pleural base nodule, left lower lobe, image 86, series 3. 4 mm right lower lobe nodule stable. Pleural based left lower lobe nodule not well appreciated on the prior study. Small right upper lobe nodule above the included field of view on the prior exam. Mild stable atelectasis/scarring in the left upper lobe lingula. Remainder of the lungs is clear. No pleural effusion or pneumothorax. Upper Abdomen: No acute or significant abnormalities. Low-attenuation liver lesion consistent with a cyst. Status post cholecystectomy. Mild aortic atherosclerosis. Musculoskeletal: No fracture or acute finding. No bone lesion. No chest wall mass. IMPRESSION: 1. Dilated ascending thoracic aorta to 4.7 cm.  Ascending thoracic aortic aneurysm. Recommend semi-annual imaging followup by CTA or MRA and referral to cardiothoracic surgery if not already obtained. This recommendation follows 2010 ACCF/AHA/AATS/ACR/ASA/SCA/SCAI/SIR/STS/SVM Guidelines for the Diagnosis and Management of Patients With Thoracic Aortic Disease. Circulation. 2010; [id number]. Aortic aneurysm NOS (ICD10-I71.9) 2. Mild aortic atherosclerosis. 3. No acute findings in the chest. 4. 3 small pulmonary nodules, 4 mm and less, 4 mm nodule stable from the prior CT, benign. No follow-up needed if patient is low-risk (and has no known or suspected primary neoplasm). Non-contrast chest CT can be considered in 12 months if patient is high-risk. This recommendation follows the consensus statement: Guidelines for Management of Incidental Pulmonary Nodules Detected on CT Images: From the Fleischner Society 2017; Radiology 2017; 284:228-243. Aortic Atherosclerosis (ICD10-I70.0). Electronically Signed   By: Lajean Manes M.D.   On: 08/30/2022 15:29     Assessment/Plan There are no diagnoses linked to this encounter.   Hortencia Pilar, MD  09/19/2022 2:37 PM

## 2022-09-21 ENCOUNTER — Ambulatory Visit (INDEPENDENT_AMBULATORY_CARE_PROVIDER_SITE_OTHER): Payer: BC Managed Care – PPO | Admitting: Vascular Surgery

## 2022-09-21 DIAGNOSIS — I890 Lymphedema, not elsewhere classified: Secondary | ICD-10-CM

## 2022-09-21 DIAGNOSIS — I7121 Aneurysm of the ascending aorta, without rupture: Secondary | ICD-10-CM

## 2022-09-21 DIAGNOSIS — I77811 Abdominal aortic ectasia: Secondary | ICD-10-CM

## 2022-09-21 DIAGNOSIS — I10 Essential (primary) hypertension: Secondary | ICD-10-CM

## 2022-09-21 DIAGNOSIS — K219 Gastro-esophageal reflux disease without esophagitis: Secondary | ICD-10-CM

## 2022-09-27 DIAGNOSIS — E785 Hyperlipidemia, unspecified: Secondary | ICD-10-CM | POA: Insufficient documentation

## 2022-09-27 NOTE — Progress Notes (Signed)
MRN : [id number]  Darrell Rios is a 67 y.o. ([birth year]) male who presents with chief complaint of check circulation.  History of Present Illness:   Patient is seen for evaluation of  Two issues: 1.leg swelling and 2. An ascending TAA noted on CT scan.    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 Patient denies abdominal pain, chest pain or back pain, no other abdominal complaints. No changes suggesting embolic episodes.    There have been no interval changes in the patient's overall health care since his last visit.   Patient denies amaurosis fugax or TIA symptoms. There is no history of claudication or rest pain symptoms of the lower extremities. The patient denies angina or shortness of breath.    The patient is also followed for leg swelling. The swelling is associated with pain and discoloration. The patient notes that in the morning the legs are significantly improved but they steadily worsened throughout the course of the day. Elevation makes the legs better, dependency makes them much worse.    There is no history of past or interval ulcerations associated with the swelling.    The patient denies any recent changes in their medications.   The patient has been wearing graduated compression on a routine basis for two years (that is more than 100 weeks).   The patient has no had any past angiography, interventions or vascular surgery.    The patient denies a history of DVT or PE. There is no prior history of phlebitis.   There is no history of radiation treatment to the groin or pelvis No history of malignancies. No history of trauma or groin or pelvic surgery. No history of foreign travel or parasitic infections area    CT chest dated 08/30/2022 showed Patient is seen for evaluation of  Two issues: 1.leg swelling and 2. An ascending TAA noted on CT scan.    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 Patient denies abdominal pain, chest pain or back pain, no other abdominal complaints. No changes suggesting embolic episodes.    There have been no interval changes in the patient's overall health care since his last visit.   Patient denies amaurosis fugax or TIA symptoms. There is no history of claudication or rest pain symptoms of the lower extremities. The patient denies angina or shortness of breath.    The patient is also followed for leg swelling. The swelling is associated with pain and discoloration. The patient notes that in the morning the legs are significantly improved but they steadily worsened throughout the course of the day. Elevation makes the legs better, dependency makes them much worse.    There is no history of past or interval ulcerations associated with the swelling.    The patient denies any recent changes in their medications.   The patient has been wearing graduated compression on a routine basis for two years (that is more than 100 weeks).   The patient has no had any past angiography, interventions or vascular surgery.    The patient denies a history of DVT or PE. There is no prior history of phlebitis.   There is no history of radiation treatment to the groin or pelvis No history of malignancies. No history of trauma or  groin or pelvic surgery. No history of foreign travel or parasitic infections area    CT of the chest dated 08/29/2022 is reviewed by me and demonstrates a 4.7 cm ascending thoracic aortic aneurysm.  Previous CT was performed 05/20/2020 showed an incidental 4.8 cm ascending TAA.  There is been no significant change in the aortic dilatation   Duplex US of the aorta and iliac arteries done today shows the abdominal aorta measures 2.5 cm.  No outpatient medications have been marked as taking for the 09/28/22 encounter  (Appointment) with Delana Meyer, Dolores Lory, MD.    Past Medical History:  Diagnosis Date   COVID-19 04/04/2020   asymptomatic COVID   Hypertension    Sleep apnea     Past Surgical History:  Procedure Laterality Date   CHOLECYSTECTOMY     HERNIA REPAIR     JOINT REPLACEMENT     NECK SURGERY     TONSILLECTOMY      Social History Social History   Tobacco Use   Smoking status: Former    Types: Cigarettes    Quit date: 07/09/2019    Years since quitting: 3.2   Smokeless tobacco: Never  Vaping Use   Vaping Use: Never used  Substance Use Topics   Alcohol use: Yes    Alcohol/week: 1.0 standard drink of alcohol    Types: 1 Glasses of wine per week   Drug use: Never    Family History Family History  Problem Relation Age of Onset   Cancer Father     No Known Allergies   REVIEW OF SYSTEMS (Negative unless checked)  Constitutional: '[]'$ Weight loss  '[]'$ Fever  '[]'$ Chills Cardiac: '[]'$ Chest pain   '[]'$ Chest pressure   '[]'$ Palpitations   '[]'$ Shortness of breath when laying flat   '[]'$ Shortness of breath with exertion. Vascular:  '[x]'$ Pain in legs with walking   '[]'$ Pain in legs at rest  '[]'$ History of DVT   '[]'$ Phlebitis   '[]'$ Swelling in legs   '[]'$ Varicose veins   '[]'$ Non-healing ulcers Pulmonary:   '[]'$ Uses home oxygen   '[]'$ Productive cough   '[]'$ Hemoptysis   '[]'$ Wheeze  '[]'$ COPD   '[]'$ Asthma Neurologic:  '[]'$ Dizziness   '[]'$ Seizures   '[]'$ History of stroke   '[]'$ History of TIA  '[]'$ Aphasia   '[]'$ Vissual changes   '[]'$ Weakness or numbness in arm   '[]'$ Weakness or numbness in leg Musculoskeletal:   '[]'$ Joint swelling   '[]'$ Joint pain   '[]'$ Low back pain Hematologic:  '[]'$ Easy bruising  '[]'$ Easy bleeding   '[]'$ Hypercoagulable state   '[]'$ Anemic Gastrointestinal:  '[]'$ Diarrhea   '[]'$ Vomiting  '[]'$ Gastroesophageal reflux/heartburn   '[]'$ Difficulty swallowing. Genitourinary:  '[]'$ Chronic kidney disease   '[]'$ Difficult urination  '[]'$ Frequent urination   '[]'$ Blood in urine Skin:  '[]'$ Rashes   '[]'$ Ulcers  Psychological:  '[]'$ History of anxiety   '[]'$  History of major  depression.  Physical Examination  There were no vitals filed for this visit. There is no height or weight on file to calculate BMI. Gen: WD/WN, NAD Head: [place]/AT, No temporalis wasting.  Ear/Nose/Throat: Hearing grossly intact, nares w/o erythema or drainage Eyes: PER, EOMI, sclera nonicteric.  Neck: Supple, no masses.  No bruit or JVD.  Pulmonary:  Good air movement, no audible wheezing, no use of accessory muscles.  Cardiac: RRR, normal S1, S2, no Murmurs. Vascular:  mild trophic changes, no open wounds Vessel Right Left  Radial Palpable Palpable  PT Not Palpable Not Palpable  DP Not Palpable Not Palpable  Gastrointestinal: soft, non-distended. No guarding/no peritoneal signs.  Musculoskeletal: M/S 5/5 throughout.  No visible deformity.  Neurologic: CN 2-12 intact. Pain and light touch intact in extremities.  Symmetrical.  Speech is fluent. Motor exam as listed above. Psychiatric: Judgment intact, Mood & affect appropriate for pt's clinical situation. Dermatologic: No rashes or ulcers noted.  No changes consistent with cellulitis.   CBC Lab Results  Component Value Date   WBC 14.7 (H) 05/09/2022   HGB 13.1 05/09/2022   HCT 39.8 05/09/2022   MCV 88.6 05/09/2022   PLT 286 05/09/2022    BMET    Component Value Date/Time   NA 137 05/09/2022 1442   K 3.9 05/09/2022 1442   CL 105 05/09/2022 1442   CO2 24 05/09/2022 1442   GLUCOSE 98 05/09/2022 1442   BUN 19 05/09/2022 1442   CREATININE 0.94 05/09/2022 1442   CALCIUM 8.6 (L) 05/09/2022 1442   GFRNONAA >60 05/09/2022 1442   CrCl cannot be calculated (Patient's most recent lab result is older than the maximum 21 days allowed.).  COAG No results found for: "INR", "PROTIME"  Radiology CT CHEST WO CONTRAST  Result Date: 08/30/2022 CLINICAL DATA:  Preop planning.  Aortic aneurysm. EXAM: CT CHEST WITHOUT CONTRAST TECHNIQUE: Multidetector CT imaging of the chest was performed following the standard protocol without IV  contrast. RADIATION DOSE REDUCTION: This exam was performed according to the departmental dose-optimization program which includes automated exposure control, adjustment of the mA and/or kV according to patient size and/or use of iterative reconstruction technique. COMPARISON:  Cardiac CT, 05/20/2020. FINDINGS: Cardiovascular: Heart normal in size and configuration. No pericardial effusion. No coronary artery calcifications. Ascending thoracic aorta measures 4.7 cm in diameter, without change from the prior CT allowing for differences in measurement technique. Aorta measures 4.5 cm at the sino-tubular junction, 3.1 cm along the distal arch distal to the left subclavian artery origin and up to 2.6 cm along the descending portion. Mild thoracic aortic atherosclerotic calcifications. Mediastinum/Nodes: No enlarged mediastinal or axillary lymph nodes. Thyroid gland, trachea, and esophagus demonstrate no significant findings. Lungs/Pleura: 4 mm subpleural nodule, right lower lobe, image 115, series 3. 3 mm subpleural nodule, right upper lobe, image 43, series 3. 3 mm pleural base nodule, left lower lobe, image 86, series 3. 4 mm right lower lobe nodule stable. Pleural based left lower lobe nodule not well appreciated on the prior study. Small right upper lobe nodule above the included field of view on the prior exam. Mild stable atelectasis/scarring in the left upper lobe lingula. Remainder of the lungs is clear. No pleural effusion or pneumothorax. Upper Abdomen: No acute or significant abnormalities. Low-attenuation liver lesion consistent with a cyst. Status post cholecystectomy. Mild aortic atherosclerosis. Musculoskeletal: No fracture or acute finding. No bone lesion. No chest wall mass. IMPRESSION: 1. Dilated ascending thoracic aorta to 4.7 cm. Ascending thoracic aortic aneurysm. Recommend semi-annual imaging followup by CTA or MRA and referral to cardiothoracic surgery if not already obtained. This recommendation  follows 2010 ACCF/AHA/AATS/ACR/ASA/SCA/SCAI/SIR/STS/SVM Guidelines for the Diagnosis and Management of Patients With Thoracic Aortic Disease. Circulation. 2010; 121: K025-K270. Aortic aneurysm NOS (ICD10-I71.9) 2. Mild aortic atherosclerosis. 3. No acute findings in the chest. 4. 3 small pulmonary nodules, 4 mm and less, 4 mm nodule stable from the prior CT, benign. No follow-up needed if patient is low-risk (and has no known or suspected primary neoplasm). Non-contrast chest CT can be considered in 12 months if patient is high-risk. This recommendation follows the consensus statement: Guidelines for Management of Incidental Pulmonary Nodules Detected on CT Images: From the Fleischner Society 2017; Radiology 2017; 284:228-243. Aortic  Atherosclerosis (ICD10-I70.0). Electronically Signed   By: Lajean Manes M.D.   On: 08/30/2022 15:29     Assessment/Plan 1. Aneurysm of ascending aorta without rupture (HCC) Recommend:  No surgery or intervention is indicated at this time.  The patient has an asymptomatic thoracic aortic aneurysm that is less than 6.0 cm in maximal diameter.  I have discussed the natural history of thoracic aortic aneurysm and the small risk of rupture for aneurysm less than 6.5 cm in size.  However, as these small aneurysms tend to enlarge over time, continued surveillance with CT scan is mandatory.   I have also discussed optimizing medical management with hypertension and lipid control and the importance of abstinence from tobacco.  The patient is also encouraged to exercise a minimum of 30 minutes 4 times a week.   Should the patient develop new onset chest or back pain or signs of peripheral embolization they are instructed to seek medical attention immediately and to alert the physician providing care that they have an aneurysm in the chest.   The patient voices their understanding.  The patient will return as ordered with a CT scan of the chest    CT scan should be ordered in  2.5 years.  2. Lymphedema Recommend:  No surgery or intervention at this point in time.  I have reviewed my discussion with the patient regarding venous insufficiency and why it causes symptoms. I have discussed with the patient the chronic skin changes that accompany venous insufficiency and the long term sequela such as ulceration. Patient will contnue wearing graduated compression stockings on a daily basis, as this has provided excellent control of his edema. The patient will put the stockings on first thing in the morning and removing them in the evening. The patient is reminded not to sleep in the stockings.  In addition, behavioral modification including elevation during the day will be initiated. Exercise is strongly encouraged.  Previous duplex ultrasound of the lower extremities shows normal deep system, no significant superficial reflux was identified.  Given the patient's good control and lack of any problems regarding the venous insufficiency and lymphedema a lymph pump in not need at this time.    3. Abdominal aortic ectasia (HCC) Recommend: No surgery or intervention is indicated at this time.  The patient has an asymptomatic abdominal aortic aneurysm that is less than 4 cm in maximal diameter.    I have reviewed the natural history of abdominal aortic aneurysm and the small risk of rupture for aneurysm less than 5 cm in size.  However, as these small aneurysms tend to enlarge over time, continued surveillance with ultrasound or CT scan is mandatory.   I have also discussed optimizing medical management with hypertension and lipid control and the importance of abstinence from tobacco.  The patient is also encouraged to exercise a minimum of 30 minutes 4 times a week.   Should the patient develop new onset abdominal or back pain or signs of peripheral embolization they are instructed to seek medical attention immediately and to alert the physician providing care that they  have an aneurysm.   The patient voices their understanding.  The patient will return in 36 months with an aortic duplex.  4. Essential hypertension Continue antihypertensive medications as already ordered, these medications have been reviewed and there are no changes at this time.  5. Mixed hyperlipidemia Continue statin as ordered and reviewed, no changes at this time    Hortencia Pilar, MD  09/27/2022 11:15 AM

## 2022-09-28 ENCOUNTER — Ambulatory Visit (INDEPENDENT_AMBULATORY_CARE_PROVIDER_SITE_OTHER): Payer: BC Managed Care – PPO | Admitting: Vascular Surgery

## 2022-09-28 ENCOUNTER — Encounter (INDEPENDENT_AMBULATORY_CARE_PROVIDER_SITE_OTHER): Payer: Self-pay | Admitting: Vascular Surgery

## 2022-09-28 VITALS — BP 127/77 | HR 88 | Resp 16 | Wt 278.4 lb

## 2022-09-28 DIAGNOSIS — E782 Mixed hyperlipidemia: Secondary | ICD-10-CM

## 2022-09-28 DIAGNOSIS — I10 Essential (primary) hypertension: Secondary | ICD-10-CM | POA: Diagnosis not present

## 2022-09-28 DIAGNOSIS — I7121 Aneurysm of the ascending aorta, without rupture: Secondary | ICD-10-CM

## 2022-09-28 DIAGNOSIS — I890 Lymphedema, not elsewhere classified: Secondary | ICD-10-CM

## 2022-09-28 DIAGNOSIS — I77811 Abdominal aortic ectasia: Secondary | ICD-10-CM

## 2022-09-30 ENCOUNTER — Encounter (INDEPENDENT_AMBULATORY_CARE_PROVIDER_SITE_OTHER): Payer: Self-pay | Admitting: Vascular Surgery

## 2023-10-01 ENCOUNTER — Other Ambulatory Visit (INDEPENDENT_AMBULATORY_CARE_PROVIDER_SITE_OTHER): Payer: Self-pay | Admitting: Vascular Surgery

## 2023-10-01 DIAGNOSIS — I77811 Abdominal aortic ectasia: Secondary | ICD-10-CM

## 2023-10-03 NOTE — Progress Notes (Signed)
MRN : [id number]  Darrell Rios is a 68 y.o. ([birth year]) male who presents with chief complaint of check circulation.  History of Present Illness:  The patient presents to the office for evaluation of an ascending thoracic aortic aneurysm. The aneurysm was found incidentally by CT scan. Patient denies chest pain or unusual back pain, no other chest or abdominal complaints.  No history of an abrupt onset of a painful toe associated with blue discoloration.     No family history of TAA/AAA.   Patient denies amaurosis fugax or TIA symptoms.  There is no history of claudication or rest pain symptoms of the lower extremities.   The patient denies angina or shortness of breath.  Duplex ultrasound of the abdominal aorta demonstrates abdominal aortic Taisha with a infrarenal aorta measuring 2.5 cm  No outpatient medications have been marked as taking for the 10/04/23 encounter (Appointment) with Gilda Crease, Latina Craver, MD.    Past Medical History:  Diagnosis Date   COVID-19 04/04/2020   asymptomatic COVID   Hypertension    Sleep apnea     Past Surgical History:  Procedure Laterality Date   CHOLECYSTECTOMY     HERNIA REPAIR     JOINT REPLACEMENT     NECK SURGERY     TONSILLECTOMY      Social History Social History   Tobacco Use   Smoking status: Former    Current packs/day: 0.00    Types: Cigarettes    Quit date: 07/09/2019    Years since quitting: 4.2   Smokeless tobacco: Never  Vaping Use   Vaping status: Never Used  Substance Use Topics   Alcohol use: Yes    Alcohol/week: 1.0 standard drink of alcohol    Types: 1 Glasses of wine per week   Drug use: Never    Family History Family History  Problem Relation Age of Onset   Cancer Father     No Known Allergies   REVIEW OF SYSTEMS (Negative unless checked)  Constitutional: [] Weight loss  [] Fever  [] Chills Cardiac: [] Chest pain   [] Chest  pressure   [] Palpitations   [] Shortness of breath when laying flat   [] Shortness of breath with exertion. Vascular:  [x] Pain in legs with walking   [] Pain in legs at rest  [] History of DVT   [] Phlebitis   [x] Swelling in legs   [] Varicose veins   [] Non-healing ulcers Pulmonary:   [] Uses home oxygen   [] Productive cough   [] Hemoptysis   [] Wheeze  [] COPD   [] Asthma Neurologic:  [] Dizziness   [] Seizures   [] History of stroke   [] History of TIA  [] Aphasia   [] Vissual changes   [] Weakness or numbness in arm   [] Weakness or numbness in leg Musculoskeletal:   [] Joint swelling   [x] Joint pain   [] Low back pain Hematologic:  [] Easy bruising  [] Easy bleeding   [] Hypercoagulable state   [] Anemic Gastrointestinal:  [] Diarrhea   [] Vomiting  [x] Gastroesophageal reflux/heartburn   [] Difficulty swallowing. Genitourinary:  [] Chronic kidney disease   [] Difficult urination  [] Frequent urination   [] Blood in urine Skin:  [] Rashes   [] Ulcers  Psychological:  []   History of anxiety   []  History of major depression.  Physical Examination  There were no vitals filed for this visit. There is no height or weight on file to calculate BMI. Gen: WD/WN, NAD Head: [place]/AT, No temporalis wasting.  Ear/Nose/Throat: Hearing grossly intact, nares w/o erythema or drainage Eyes: PER, EOMI, sclera nonicteric.  Neck: Supple, no masses.  No bruit or JVD.  Pulmonary:  Good air movement, no audible wheezing, no use of accessory muscles.  Cardiac: RRR, normal S1, S2, no Murmurs. Vascular:  mild trophic changes, no open wounds Vessel Right Left  Radial Palpable Palpable  PT Not Palpable Not Palpable  DP Not Palpable Not Palpable  Gastrointestinal: soft, non-distended. No guarding/no peritoneal signs.  Musculoskeletal: M/S 5/5 throughout.  No visible deformity.  Neurologic: CN 2-12 intact. Pain and light touch intact in extremities.  Symmetrical.  Speech is fluent. Motor exam as listed above. Psychiatric: Judgment intact, Mood & affect  appropriate for pt's clinical situation. Dermatologic: No rashes or ulcers noted.  No changes consistent with cellulitis.   CBC Lab Results  Component Value Date   WBC 14.7 (H) 05/09/2022   HGB 13.1 05/09/2022   HCT 39.8 05/09/2022   MCV 88.6 05/09/2022   PLT 286 05/09/2022    BMET    Component Value Date/Time   NA 137 05/09/2022 1442   K 3.9 05/09/2022 1442   CL 105 05/09/2022 1442   CO2 24 05/09/2022 1442   GLUCOSE 98 05/09/2022 1442   BUN 19 05/09/2022 1442   CREATININE 0.94 05/09/2022 1442   CALCIUM 8.6 (L) 05/09/2022 1442   GFRNONAA >60 05/09/2022 1442   CrCl cannot be calculated (Patient's most recent lab result is older than the maximum 21 days allowed.).  COAG No results found for: "INR", "PROTIME"  Radiology No results found.   Assessment/Plan 1. Aneurysm of ascending aorta without rupture (HCC) (Primary) Recommend:  No surgery or intervention is indicated at this time.  The patient has an asymptomatic thoracic aortic aneurysm that is less than 6.0 cm in maximal diameter.  I have discussed the natural history of thoracic aortic aneurysm and the small risk of rupture for aneurysm less than 6.5 cm in size.  However, as these small aneurysms tend to enlarge over time, continued surveillance with CT scan is mandatory.   I have also discussed optimizing medical management with hypertension and lipid control and the negative effect that any tobacco products have on aneurysmal disease.  The patient is also encouraged to exercise a minimum of 30 minutes 4 times a week.   Should the patient develop new onset chest or back pain or signs of peripheral embolization they are instructed to seek medical attention immediately and to alert the physician providing care that they have an aneurysm in the chest.   The patient voices their understanding.  The patient will return as ordered with a CT scan of the chest - CT CHEST WO CONTRAST; Future  2. Essential  hypertension Continue antihypertensive medications as already ordered, these medications have been reviewed and there are no changes at this time.  3. Mixed hyperlipidemia Continue statin as ordered and reviewed, no changes at this time  4. Gastroesophageal reflux disease, unspecified whether esophagitis present Continue PPI as already ordered, this medication has been reviewed and there are no changes at this time.  Avoidence of caffeine and alcohol  Moderate elevation of the head of the bed   5. Infrarenal abdominal aortic aneurysm (AAA) without rupture (HCC) Recommend: No surgery  or intervention is indicated at this time.  The patient has an asymptomatic abdominal aortic aneurysm that is less than 4 cm in maximal diameter.    I have reviewed the natural history of abdominal aortic aneurysm and the small risk of rupture for aneurysm less than 5 cm in size.  However, as these small aneurysms tend to enlarge over time, continued surveillance with ultrasound or CT scan is mandatory.   I have also discussed optimizing medical management with hypertension and lipid control and the negative effect that any tobacco products have on aneurysmal disease.  The patient is also encouraged to exercise a minimum of 30 minutes 4 times a week.   Should the patient develop new onset abdominal or back pain or signs of peripheral embolization they are instructed to seek medical attention immediately and to alert the physician providing care that they have an aneurysm.   The patient voices their understanding.  The patient will return in 12 months with an aortic duplex. - CT ABDOMEN PELVIS WO CONTRAST; Future d   Levora Dredge, MD  10/03/2023 10:47 AM

## 2023-10-04 ENCOUNTER — Encounter (INDEPENDENT_AMBULATORY_CARE_PROVIDER_SITE_OTHER): Payer: Self-pay | Admitting: Vascular Surgery

## 2023-10-04 ENCOUNTER — Ambulatory Visit (INDEPENDENT_AMBULATORY_CARE_PROVIDER_SITE_OTHER): Payer: Medicare Other | Admitting: Vascular Surgery

## 2023-10-04 ENCOUNTER — Ambulatory Visit (INDEPENDENT_AMBULATORY_CARE_PROVIDER_SITE_OTHER): Payer: Self-pay

## 2023-10-04 VITALS — BP 109/71 | HR 91 | Resp 18 | Ht 69.0 in | Wt 290.0 lb

## 2023-10-04 DIAGNOSIS — E782 Mixed hyperlipidemia: Secondary | ICD-10-CM | POA: Diagnosis not present

## 2023-10-04 DIAGNOSIS — I7121 Aneurysm of the ascending aorta, without rupture: Secondary | ICD-10-CM | POA: Diagnosis not present

## 2023-10-04 DIAGNOSIS — I77811 Abdominal aortic ectasia: Secondary | ICD-10-CM | POA: Diagnosis not present

## 2023-10-04 DIAGNOSIS — I10 Essential (primary) hypertension: Secondary | ICD-10-CM

## 2023-10-04 DIAGNOSIS — K219 Gastro-esophageal reflux disease without esophagitis: Secondary | ICD-10-CM | POA: Diagnosis not present

## 2023-10-04 DIAGNOSIS — I7143 Infrarenal abdominal aortic aneurysm, without rupture: Secondary | ICD-10-CM

## 2023-10-06 ENCOUNTER — Encounter (INDEPENDENT_AMBULATORY_CARE_PROVIDER_SITE_OTHER): Payer: Self-pay | Admitting: Vascular Surgery

## 2023-10-06 DIAGNOSIS — I714 Abdominal aortic aneurysm, without rupture, unspecified: Secondary | ICD-10-CM | POA: Insufficient documentation

## 2023-12-31 ENCOUNTER — Ambulatory Visit: Admission: RE | Admit: 2023-12-31 | Discharge: 2023-12-31 | Source: Ambulatory Visit

## 2023-12-31 VITALS — BP 119/75 | HR 85 | Temp 98.6°F | Resp 18

## 2023-12-31 DIAGNOSIS — R29818 Other symptoms and signs involving the nervous system: Secondary | ICD-10-CM | POA: Diagnosis not present

## 2023-12-31 DIAGNOSIS — R42 Dizziness and giddiness: Secondary | ICD-10-CM | POA: Diagnosis not present

## 2023-12-31 NOTE — ED Triage Notes (Signed)
 Pt presents with dizziness that started yesterday. His head feels clogged, he feels numb and the room is spinning.

## 2023-12-31 NOTE — Discharge Instructions (Addendum)
 Please go to the emergency department at [facility] to be evaluated for possible stroke versus vertigo.

## 2023-12-31 NOTE — ED Provider Notes (Signed)
 [facility]    CSN: [id number] Arrival date & time: 12/31/23  1343      History   Chief Complaint Chief Complaint  Patient presents with   Dizziness    Since yesterday very dizzy and foggy head., numbness. Told by primary care to go to urgent care. - Entered by patient    HPI Darrell Rios is a 68 y.o. male.   HPI  68 year old male with past medical history significant for obesity, hypertension, sleep apnea presents for evaluation of dizziness and room spinning that started yesterday at around noon time.  It did not improve with sleep.  It is also associated with a foggy feeling and numbness and tingling of his arms, head, tongue, and legs.  Also some mild shortness of breath.  He denies any nausea or vomiting or changes to his vision.  He states that today the room spinning has improved but now he has a frontal headache that he describes as a dull 3/10 that waxes and wanes.  He does not have a history of headaches.  His father suffered from vertigo and his sister has vertigo but he has never been diagnosed with vertigo.  He contacted his PCP who advised him to come to urgent care for evaluation.  Past Medical History:  Diagnosis Date   COVID-19 04/04/2020   asymptomatic COVID   Hypertension    Sleep apnea     Patient Active Problem List   Diagnosis Date Noted   AAA (abdominal aortic aneurysm) without rupture (HCC) 10/06/2023   Hyperlipidemia 09/27/2022   Abdominal aortic ectasia (HCC) 08/13/2022   Carpal tunnel syndrome of right wrist 08/09/2020   Colon polyps 08/09/2020   OSA (obstructive sleep apnea) 08/09/2020   Prostatitis 08/09/2020   Ascending aortic aneurysm (HCC) 08/09/2020   Lymphedema 08/09/2020   Arthritis of right acromioclavicular joint 04/14/2020   Tear of right rotator cuff 04/14/2020   Shortness of breath 01/22/2020   Conductive hearing loss of right ear with unrestricted hearing of left ear 08/11/2019   Left otitis media with effusion  08/11/2019   Primary osteoarthritis of right knee 01/10/2019   BMI 40.0-44.9, adult (HCC) 08/19/2018   Lipoma of scalp 07/17/2018   Trochanteric bursitis of both hips 07/17/2018   Strain of quadriceps tendon 04/29/2018   Right elbow pain 02/12/2018   Dysfunction of left eustachian tube 02/11/2018   Ulnar neuropathy of both upper extremities 01/30/2018   Essential hypertension 10/19/2017   Allergic rhinitis 11/03/2015   Chronic nasal congestion 09/22/2015   Increased urinary frequency 07/19/2015   BPH (benign prostatic hyperplasia) 06/20/2013   Cervical spinal stenosis 06/20/2013   GERD (gastroesophageal reflux disease) 06/20/2013   Elevated blood pressure 06/16/2013   Vitamin D deficiency 06/16/2013   H/O hematuria 06/12/2013   Biceps tendonitis 04/04/2013   Golfer's elbow 04/04/2013   Neck pain 04/04/2013   Ulnar nerve impingement 04/04/2013    Past Surgical History:  Procedure Laterality Date   CHOLECYSTECTOMY     HERNIA REPAIR     JOINT REPLACEMENT     NECK SURGERY     TONSILLECTOMY         Home Medications    Prior to Admission medications   Medication Sig Start Date End Date Taking? Authorizing Provider  furosemide (LASIX) 20 MG tablet Take 20 mg by mouth daily as needed. 11/27/23  Yes [provider]  montelukast (SINGULAIR) 10 MG tablet Take 1 tablet by mouth at bedtime. 12/04/23 12/03/24 Yes [provider]  Cherlynn Cornfield  550 MG TABS tablet Take 550 mg by mouth 3 (three) times daily. 10/26/23  Yes [provider]  albuterol (VENTOLIN HFA) 108 (90 Base) MCG/ACT inhaler SMARTSIG:2 inhalation Via Inhaler Every 4 Hours PRN    [provider]  alfuzosin (UROXATRAL) 10 MG 24 hr tablet Take 1 tablet by mouth daily. 01/09/23   [provider]  amlodipine-olmesartan (AZOR) 10-20 MG tablet Take by mouth. Patient not taking: Reported on 09/28/2022 03/25/19   [provider]  amLODipine-olmesartan (AZOR) 5-20 MG tablet Take 1 tablet by  mouth daily. 04/28/21   [provider]  aspirin EC 81 MG tablet Take by mouth. 02/21/19   [provider]  atorvastatin (LIPITOR) 10 MG tablet Take 10 mg by mouth daily.    [provider]  celecoxib (CELEBREX) 200 MG capsule Take 200 mg by mouth daily.    [provider]  cyclobenzaprine  (FLEXERIL ) 10 MG tablet as needed. 03/09/23   [provider]  docusate sodium  (COLACE) 100 MG capsule Take 1 tablet once or twice daily as needed for constipation while taking narcotic pain medicine 08/05/21   Lynnda Sas, MD  doxycycline  (VIBRAMYCIN ) 100 MG capsule Take 1 capsule (100 mg total) by mouth 2 (two) times daily. Patient not taking: Reported on 09/28/2022 05/09/22   Kent Pear, NP  DULoxetine (CYMBALTA) 60 MG capsule Take 2 capsules by mouth daily. 05/23/23   [provider]  finasteride (PROSCAR) 5 MG tablet Take by mouth. 03/19/19   [provider]  fluticasone (FLONASE) 50 MCG/ACT nasal spray Place into both nostrils.    [provider]  hydrocortisone (ANUSOL-HC) 25 MG suppository as needed.    [provider]  ibuprofen (ADVIL) 800 MG tablet Take 800 mg by mouth every 6 (six) hours as needed. 01/22/19   [provider]  meloxicam (MOBIC) 15 MG tablet Take 15 mg by mouth daily. Patient not taking: Reported on 08/10/2022 04/13/22   [provider]  oxyCODONE -acetaminophen  (PERCOCET) 5-325 MG tablet Take 2 tablets by mouth every 8 (eight) hours as needed for severe pain. Patient not taking: Reported on 09/28/2022 08/05/21   Lynnda Sas, MD  pantoprazole (PROTONIX) 40 MG tablet Take 1 tablet by mouth 2 (two) times daily. 04/30/23   [provider]  phentermine 30 MG capsule Take by mouth. 10/03/23   [provider]  pregabalin (LYRICA) 150 MG capsule Take 1 capsule by mouth 3 (three) times daily. 06/18/23   [provider]  tamsulosin  (FLOMAX ) 0.4 MG CAPS capsule Take 1 tablet by  mouth daily until you pass the kidney stone or no longer have symptoms 08/05/21   Lynnda Sas, MD    Family History Family History  Problem Relation Age of Onset   Cancer Father     Social History Social History   Tobacco Use   Smoking status: Former    Current packs/day: 0.00    Types: Cigarettes    Quit date: 07/09/2019    Years since quitting: 4.4   Smokeless tobacco: Never  Vaping Use   Vaping status: Never Used  Substance Use Topics   Alcohol use: Yes    Alcohol/week: 1.0 standard drink of alcohol    Types: 1 Glasses of wine per week   Drug use: Never     Allergies   Patient has no known allergies.   Review of Systems Review of Systems  Constitutional:  Negative for fever.  Respiratory:  Positive for shortness of breath.   Cardiovascular:  Negative  for chest pain and palpitations.  Gastrointestinal:  Negative for nausea and vomiting.  Neurological:  Positive for dizziness, numbness and headaches. Negative for syncope, facial asymmetry and weakness.  Psychiatric/Behavioral:  Positive for decreased concentration.      Physical Exam Triage Vital Signs ED Triage Vitals  Encounter Vitals Group     BP      Systolic BP Percentile      Diastolic BP Percentile      Pulse      Resp      Temp      Temp src      SpO2      Weight      Height      Head Circumference      Peak Flow      Pain Score      Pain Loc      Pain Education      Exclude from Growth Chart    No data found.  Updated Vital Signs BP 119/75 (BP Location: Left Arm)   Pulse 85   Temp 98.6 F (37 C) (Oral)   Resp 18   SpO2 95%   Visual Acuity Right Eye Distance:   Left Eye Distance:   Bilateral Distance:    Right Eye Near:   Left Eye Near:    Bilateral Near:     Physical Exam Vitals and nursing note reviewed.  Constitutional:      Appearance: Normal appearance. He is obese. He is not ill-appearing.  HENT:     Head: Normocephalic and atraumatic.     Right Ear: Tympanic  membrane, ear canal and external ear normal. There is no impacted cerumen.     Left Ear: Tympanic membrane, ear canal and external ear normal. There is no impacted cerumen.     Mouth/Throat:     Mouth: Mucous membranes are moist.     Pharynx: Oropharynx is clear. No oropharyngeal exudate or posterior oropharyngeal erythema.  Eyes:     Extraocular Movements: Extraocular movements intact.     Conjunctiva/sclera: Conjunctivae normal.     Pupils: Pupils are equal, round, and reactive to light.  Cardiovascular:     Rate and Rhythm: Normal rate and regular rhythm.     Pulses: Normal pulses.     Heart sounds: Normal heart sounds. No murmur heard.    No friction rub. No gallop.  Pulmonary:     Effort: Pulmonary effort is normal.     Breath sounds: Normal breath sounds. No wheezing, rhonchi or rales.  Musculoskeletal:        General: Normal range of motion.  Skin:    General: Skin is warm and dry.     Capillary Refill: Capillary refill takes less than 2 seconds.     Findings: No rash.  Neurological:     General: No focal deficit present.     Mental Status: He is alert and oriented to person, place, and time.     Cranial Nerves: No cranial nerve deficit.     Sensory: No sensory deficit.     Motor: No weakness.     Coordination: Coordination normal.     Gait: Gait normal.     Deep Tendon Reflexes: Reflexes normal.  Psychiatric:        Mood and Affect: Mood normal.        Behavior: Behavior normal.        Thought Content: Thought content normal.        Judgment: Judgment normal.  UC Treatments / Results  Labs (all labs ordered are listed, but only abnormal results are displayed) Labs Reviewed - No data to display  EKG Normal sinus rhythm with a ventricular rate of 80 bpm PR interval 160 ms QRS duration 102 ms QT/QTc 380/430 ms Left axis deviation but no acute ST or T wave abnormalities noted.  Radiology No results found.  Procedures Procedures (including critical care  time)  Medications Ordered in UC Medications - No data to display  Initial Impression / Assessment and Plan / UC Course  I have reviewed the triage vital signs and the nursing notes.  Pertinent labs & imaging results that were available during my care of the patient were reviewed by me and considered in my medical decision making (see chart for details).   Patient is a nontoxic-appearing 68 year old male presenting for evaluation of neurologic symptoms as outlined HPI above.  In the exam room patient is alert and oriented x 3 and he is able to speak in full sentence without dyspnea or tachypnea.  He also has no slurred speech.  He is moving all extremities independently and cranial nerves II through XII are intact.  Pupils are equal and reactive and EOM is intact.  No nystagmus or strabismus present.  Cardiopulmonary exam reveals S1-S2 heart sounds with regular rate and rhythm lung sounds are clear to auscultation all fields.  Patient's bilateral grips, upper extremity strength, and lower extremity strength are all 5/5.  DTRs are 2+ globally with exception of his patellar reflexes as they are both decreased secondary to bilateral knee replacements.  He has no pronator drift, normal finger-to-nose and normal heel-to-shin.  Patient does have a positive Romberg.  Differential diagnose includes CVA, TIA, vertigo, cardiac dysrhythmia.  I will obtain an EKG to evaluate for any potential dysrhythmia.  EKG shows normal sinus rhythm with left axis deviation and is reading a pulmonary disease pattern.  No ST or T wave abnormalities noted.  No other tracings available for comparison in epic.  I advised the patient that his EKG was unremarkable and I do not think the cardiac arrhythmia is what is causing his symptoms.  I am not able to evaluate whether or not he has had a CVA or TIA or if this is secondary to vertigo here at urgent care because I am unable to image his head.  I have therefore recommended that he  go to the emergency department.  He has elected to go to [facility].  He is calling a friend to transport him.   Final Clinical Impressions(s) / UC Diagnoses   Final diagnoses:  Dizziness  Positive Romberg test     Discharge Instructions      Please go to the emergency department at [facility] to be evaluated for possible stroke versus vertigo.     ED Prescriptions   None    PDMP not reviewed this encounter.   Kent Pear, NP 12/31/23 1422

## 2023-12-31 NOTE — ED Notes (Signed)
 Patient is being discharged from the Urgent Care and sent to the Emergency Department via POV . Per Kent Pear NP, patient is in need of higher level of care due to possible stroke. Patient is aware and verbalizes understanding of plan of care.  Vitals:   12/31/23 1352  BP: 119/75  Pulse: 85  Resp: 18  Temp: 98.6 F (37 C)  SpO2: 95%

## 2024-01-08 ENCOUNTER — Other Ambulatory Visit: Payer: Self-pay | Admitting: Internal Medicine

## 2024-01-08 DIAGNOSIS — I7121 Aneurysm of the ascending aorta, without rupture: Secondary | ICD-10-CM

## 2024-01-11 ENCOUNTER — Ambulatory Visit: Payer: Self-pay

## 2024-01-15 ENCOUNTER — Ambulatory Visit
Admission: RE | Admit: 2024-01-15 | Discharge: 2024-01-15 | Disposition: A | Discharge status: Home / Self Care | Source: Ambulatory Visit | Attending: Internal Medicine | Admitting: Internal Medicine

## 2024-01-15 DIAGNOSIS — I7121 Aneurysm of the ascending aorta, without rupture: Secondary | ICD-10-CM | POA: Insufficient documentation

## 2024-01-15 MED ORDER — IOHEXOL 350 MG/ML SOLN
100.0000 mL | Freq: Once | INTRAVENOUS | Status: AC | PRN
  Administered 2024-01-15: 100 mL via INTRAVENOUS

## 2024-01-22 ENCOUNTER — Encounter (INDEPENDENT_AMBULATORY_CARE_PROVIDER_SITE_OTHER): Payer: Self-pay

## 2024-08-26 ENCOUNTER — Encounter (INDEPENDENT_AMBULATORY_CARE_PROVIDER_SITE_OTHER): Payer: Self-pay | Admitting: Vascular Surgery

## 2024-10-02 ENCOUNTER — Ambulatory Visit (INDEPENDENT_AMBULATORY_CARE_PROVIDER_SITE_OTHER): Payer: Medicare Other | Admitting: Vascular Surgery
